# Patient Record
Sex: Female | Born: 1945 | Race: White | Hispanic: No | Marital: Married | Smoking: Never smoker
Health system: Southern US, Community
[De-identification: ages and names within clinical notes are randomized; demographics above are authoritative.]

## PROBLEM LIST (undated history)

## (undated) DIAGNOSIS — E119 Type 2 diabetes mellitus without complications: Secondary | ICD-10-CM

## (undated) DIAGNOSIS — I10 Essential (primary) hypertension: Secondary | ICD-10-CM

## (undated) DIAGNOSIS — K219 Gastro-esophageal reflux disease without esophagitis: Secondary | ICD-10-CM

## (undated) HISTORY — DX: Essential (primary) hypertension: I10

## (undated) HISTORY — DX: Gastro-esophageal reflux disease without esophagitis: K21.9

## (undated) HISTORY — DX: Type 2 diabetes mellitus without complications: E11.9

## (undated) HISTORY — PX: NO PAST SURGERIES: SHX2092

---

## 2000-04-22 ENCOUNTER — Encounter: Payer: Self-pay | Admitting: Obstetrics and Gynecology

## 2000-04-22 ENCOUNTER — Encounter: Admission: RE | Admit: 2000-04-22 | Discharge: 2000-04-22 | Payer: Self-pay | Admitting: Obstetrics and Gynecology

## 2001-04-28 ENCOUNTER — Encounter: Admission: RE | Admit: 2001-04-28 | Discharge: 2001-04-28 | Payer: Self-pay | Admitting: Obstetrics and Gynecology

## 2001-04-28 ENCOUNTER — Encounter: Payer: Self-pay | Admitting: Obstetrics and Gynecology

## 2002-05-25 ENCOUNTER — Encounter: Admission: RE | Admit: 2002-05-25 | Discharge: 2002-05-25 | Payer: Self-pay | Admitting: Obstetrics and Gynecology

## 2002-05-25 ENCOUNTER — Encounter: Payer: Self-pay | Admitting: Obstetrics and Gynecology

## 2003-05-31 ENCOUNTER — Encounter: Admission: RE | Admit: 2003-05-31 | Discharge: 2003-05-31 | Payer: Self-pay | Admitting: Obstetrics and Gynecology

## 2003-05-31 ENCOUNTER — Encounter: Payer: Self-pay | Admitting: Obstetrics and Gynecology

## 2004-06-02 ENCOUNTER — Ambulatory Visit (HOSPITAL_COMMUNITY): Admission: RE | Admit: 2004-06-02 | Discharge: 2004-06-02 | Payer: Self-pay | Admitting: Obstetrics and Gynecology

## 2005-06-11 ENCOUNTER — Ambulatory Visit (HOSPITAL_COMMUNITY): Admission: RE | Admit: 2005-06-11 | Discharge: 2005-06-11 | Payer: Self-pay | Admitting: Obstetrics and Gynecology

## 2006-07-01 ENCOUNTER — Ambulatory Visit (HOSPITAL_COMMUNITY): Admission: RE | Admit: 2006-07-01 | Discharge: 2006-07-01 | Payer: Self-pay | Admitting: Obstetrics and Gynecology

## 2007-07-07 ENCOUNTER — Ambulatory Visit (HOSPITAL_COMMUNITY): Admission: RE | Admit: 2007-07-07 | Discharge: 2007-07-07 | Payer: Self-pay | Admitting: Obstetrics and Gynecology

## 2008-07-24 ENCOUNTER — Ambulatory Visit (HOSPITAL_COMMUNITY): Admission: RE | Admit: 2008-07-24 | Discharge: 2008-07-24 | Payer: Self-pay | Admitting: Obstetrics and Gynecology

## 2009-09-03 ENCOUNTER — Ambulatory Visit: Payer: Self-pay

## 2010-12-09 ENCOUNTER — Ambulatory Visit: Payer: Self-pay

## 2012-02-15 ENCOUNTER — Ambulatory Visit: Payer: Self-pay

## 2013-02-20 ENCOUNTER — Ambulatory Visit: Payer: Self-pay | Admitting: Family Medicine

## 2013-08-20 ENCOUNTER — Emergency Department: Payer: Self-pay | Admitting: Emergency Medicine

## 2013-08-20 LAB — CBC WITH DIFFERENTIAL/PLATELET
BASOS ABS: 0.1 10*3/uL (ref 0.0–0.1)
BASOS PCT: 1.2 %
EOS PCT: 4.8 %
Eosinophil #: 0.4 10*3/uL (ref 0.0–0.7)
HCT: 45.3 % (ref 35.0–47.0)
HGB: 15.4 g/dL (ref 12.0–16.0)
LYMPHS ABS: 2.5 10*3/uL (ref 1.0–3.6)
LYMPHS PCT: 32.4 %
MCH: 30.9 pg (ref 26.0–34.0)
MCHC: 33.9 g/dL (ref 32.0–36.0)
MCV: 91 fL (ref 80–100)
MONOS PCT: 8.8 %
Monocyte #: 0.7 x10 3/mm (ref 0.2–0.9)
Neutrophil #: 4 10*3/uL (ref 1.4–6.5)
Neutrophil %: 52.8 %
Platelet: 228 10*3/uL (ref 150–440)
RBC: 4.98 10*6/uL (ref 3.80–5.20)
RDW: 13 % (ref 11.5–14.5)
WBC: 7.6 10*3/uL (ref 3.6–11.0)

## 2013-08-20 LAB — URINALYSIS, COMPLETE
Bacteria: NONE SEEN
Bilirubin,UR: NEGATIVE
Blood: NEGATIVE
GLUCOSE, UR: NEGATIVE mg/dL (ref 0–75)
Ketone: NEGATIVE
NITRITE: NEGATIVE
PH: 7 (ref 4.5–8.0)
Protein: NEGATIVE
RBC,UR: 1 /HPF (ref 0–5)
Specific Gravity: 1.014 (ref 1.003–1.030)

## 2013-08-20 LAB — BASIC METABOLIC PANEL
ANION GAP: 2 — AB (ref 7–16)
BUN: 18 mg/dL (ref 7–18)
CHLORIDE: 105 mmol/L (ref 98–107)
CO2: 31 mmol/L (ref 21–32)
Calcium, Total: 9.2 mg/dL (ref 8.5–10.1)
Creatinine: 0.75 mg/dL (ref 0.60–1.30)
EGFR (African American): >60
EGFR (Non-African Amer.): >60
GLUCOSE: 115 mg/dL — AB (ref 65–99)
Osmolality: 278 (ref 275–301)
Potassium: 4.2 mmol/L (ref 3.5–5.1)
SODIUM: 138 mmol/L (ref 136–145)

## 2013-08-20 LAB — TROPONIN I: Troponin-I: <0.02 ng/mL

## 2014-01-23 DIAGNOSIS — E785 Hyperlipidemia, unspecified: Secondary | ICD-10-CM | POA: Insufficient documentation

## 2014-04-01 ENCOUNTER — Ambulatory Visit: Payer: Self-pay | Admitting: Internal Medicine

## 2014-10-10 DIAGNOSIS — B351 Tinea unguium: Secondary | ICD-10-CM | POA: Diagnosis not present

## 2014-10-10 DIAGNOSIS — M542 Cervicalgia: Secondary | ICD-10-CM | POA: Diagnosis not present

## 2014-10-10 DIAGNOSIS — E780 Pure hypercholesterolemia: Secondary | ICD-10-CM | POA: Diagnosis not present

## 2014-11-12 DIAGNOSIS — B078 Other viral warts: Secondary | ICD-10-CM | POA: Diagnosis not present

## 2014-11-12 DIAGNOSIS — L820 Inflamed seborrheic keratosis: Secondary | ICD-10-CM | POA: Diagnosis not present

## 2014-11-12 DIAGNOSIS — L821 Other seborrheic keratosis: Secondary | ICD-10-CM | POA: Diagnosis not present

## 2015-01-20 DIAGNOSIS — B351 Tinea unguium: Secondary | ICD-10-CM | POA: Diagnosis not present

## 2015-01-20 DIAGNOSIS — E780 Pure hypercholesterolemia: Secondary | ICD-10-CM | POA: Diagnosis not present

## 2015-01-24 DIAGNOSIS — Z0001 Encounter for general adult medical examination with abnormal findings: Secondary | ICD-10-CM | POA: Diagnosis not present

## 2015-07-17 DIAGNOSIS — R739 Hyperglycemia, unspecified: Secondary | ICD-10-CM | POA: Diagnosis not present

## 2015-07-17 DIAGNOSIS — Z1159 Encounter for screening for other viral diseases: Secondary | ICD-10-CM | POA: Diagnosis not present

## 2015-07-17 DIAGNOSIS — E7800 Pure hypercholesterolemia, unspecified: Secondary | ICD-10-CM | POA: Diagnosis not present

## 2015-07-24 DIAGNOSIS — E781 Pure hyperglyceridemia: Secondary | ICD-10-CM | POA: Diagnosis not present

## 2015-07-24 DIAGNOSIS — E7800 Pure hypercholesterolemia, unspecified: Secondary | ICD-10-CM | POA: Diagnosis not present

## 2015-09-11 DIAGNOSIS — H2513 Age-related nuclear cataract, bilateral: Secondary | ICD-10-CM | POA: Diagnosis not present

## 2015-11-03 DIAGNOSIS — H524 Presbyopia: Secondary | ICD-10-CM | POA: Diagnosis not present

## 2015-11-03 DIAGNOSIS — H5210 Myopia, unspecified eye: Secondary | ICD-10-CM | POA: Diagnosis not present

## 2016-01-19 DIAGNOSIS — E7800 Pure hypercholesterolemia, unspecified: Secondary | ICD-10-CM | POA: Diagnosis not present

## 2016-01-19 DIAGNOSIS — E781 Pure hyperglyceridemia: Secondary | ICD-10-CM | POA: Diagnosis not present

## 2016-01-26 DIAGNOSIS — K219 Gastro-esophageal reflux disease without esophagitis: Secondary | ICD-10-CM | POA: Diagnosis not present

## 2016-01-26 DIAGNOSIS — E7800 Pure hypercholesterolemia, unspecified: Secondary | ICD-10-CM | POA: Diagnosis not present

## 2016-01-26 DIAGNOSIS — Z0001 Encounter for general adult medical examination with abnormal findings: Secondary | ICD-10-CM | POA: Diagnosis not present

## 2016-01-26 DIAGNOSIS — R7301 Impaired fasting glucose: Secondary | ICD-10-CM | POA: Diagnosis not present

## 2016-07-14 DIAGNOSIS — R7301 Impaired fasting glucose: Secondary | ICD-10-CM | POA: Diagnosis not present

## 2016-07-21 DIAGNOSIS — E119 Type 2 diabetes mellitus without complications: Secondary | ICD-10-CM | POA: Insufficient documentation

## 2016-09-17 DIAGNOSIS — B9689 Other specified bacterial agents as the cause of diseases classified elsewhere: Secondary | ICD-10-CM | POA: Diagnosis not present

## 2016-09-17 DIAGNOSIS — J019 Acute sinusitis, unspecified: Secondary | ICD-10-CM | POA: Diagnosis not present

## 2016-12-23 DIAGNOSIS — H35372 Puckering of macula, left eye: Secondary | ICD-10-CM | POA: Diagnosis not present

## 2016-12-23 DIAGNOSIS — H2513 Age-related nuclear cataract, bilateral: Secondary | ICD-10-CM | POA: Diagnosis not present

## 2017-01-20 DIAGNOSIS — E119 Type 2 diabetes mellitus without complications: Secondary | ICD-10-CM | POA: Diagnosis not present

## 2017-01-27 DIAGNOSIS — E7800 Pure hypercholesterolemia, unspecified: Secondary | ICD-10-CM | POA: Diagnosis not present

## 2017-01-27 DIAGNOSIS — Z0000 Encounter for general adult medical examination without abnormal findings: Secondary | ICD-10-CM | POA: Diagnosis not present

## 2017-01-27 DIAGNOSIS — Z0001 Encounter for general adult medical examination with abnormal findings: Secondary | ICD-10-CM | POA: Diagnosis not present

## 2017-01-27 DIAGNOSIS — E119 Type 2 diabetes mellitus without complications: Secondary | ICD-10-CM | POA: Diagnosis not present

## 2017-03-11 ENCOUNTER — Other Ambulatory Visit: Payer: Self-pay | Admitting: Internal Medicine

## 2017-03-11 DIAGNOSIS — Z1231 Encounter for screening mammogram for malignant neoplasm of breast: Secondary | ICD-10-CM

## 2017-03-30 ENCOUNTER — Ambulatory Visit
Admission: RE | Admit: 2017-03-30 | Discharge: 2017-03-30 | Disposition: A | Discharge status: Home / Self Care | Payer: Medicare HMO | Source: Ambulatory Visit | Attending: Internal Medicine | Admitting: Internal Medicine

## 2017-03-30 DIAGNOSIS — Z1231 Encounter for screening mammogram for malignant neoplasm of breast: Secondary | ICD-10-CM | POA: Insufficient documentation

## 2017-04-04 DIAGNOSIS — D485 Neoplasm of uncertain behavior of skin: Secondary | ICD-10-CM | POA: Diagnosis not present

## 2017-04-04 DIAGNOSIS — D2271 Melanocytic nevi of right lower limb, including hip: Secondary | ICD-10-CM | POA: Diagnosis not present

## 2017-04-04 DIAGNOSIS — D2272 Melanocytic nevi of left lower limb, including hip: Secondary | ICD-10-CM | POA: Diagnosis not present

## 2017-04-04 DIAGNOSIS — D225 Melanocytic nevi of trunk: Secondary | ICD-10-CM | POA: Diagnosis not present

## 2017-04-04 DIAGNOSIS — D2262 Melanocytic nevi of left upper limb, including shoulder: Secondary | ICD-10-CM | POA: Diagnosis not present

## 2017-04-04 DIAGNOSIS — L570 Actinic keratosis: Secondary | ICD-10-CM | POA: Diagnosis not present

## 2017-04-07 DIAGNOSIS — L570 Actinic keratosis: Secondary | ICD-10-CM | POA: Diagnosis not present

## 2017-04-07 DIAGNOSIS — L578 Other skin changes due to chronic exposure to nonionizing radiation: Secondary | ICD-10-CM | POA: Diagnosis not present

## 2017-07-22 DIAGNOSIS — E119 Type 2 diabetes mellitus without complications: Secondary | ICD-10-CM | POA: Diagnosis not present

## 2017-07-29 DIAGNOSIS — Z5181 Encounter for therapeutic drug level monitoring: Secondary | ICD-10-CM | POA: Diagnosis not present

## 2017-07-29 DIAGNOSIS — E119 Type 2 diabetes mellitus without complications: Secondary | ICD-10-CM | POA: Diagnosis not present

## 2017-07-29 DIAGNOSIS — K219 Gastro-esophageal reflux disease without esophagitis: Secondary | ICD-10-CM | POA: Diagnosis not present

## 2017-07-29 DIAGNOSIS — E7800 Pure hypercholesterolemia, unspecified: Secondary | ICD-10-CM | POA: Diagnosis not present

## 2018-01-26 DIAGNOSIS — E119 Type 2 diabetes mellitus without complications: Secondary | ICD-10-CM | POA: Diagnosis not present

## 2018-01-26 DIAGNOSIS — Z5181 Encounter for therapeutic drug level monitoring: Secondary | ICD-10-CM | POA: Diagnosis not present

## 2018-01-26 DIAGNOSIS — E7800 Pure hypercholesterolemia, unspecified: Secondary | ICD-10-CM | POA: Diagnosis not present

## 2018-02-02 DIAGNOSIS — Z0000 Encounter for general adult medical examination without abnormal findings: Secondary | ICD-10-CM | POA: Diagnosis not present

## 2018-02-02 DIAGNOSIS — K219 Gastro-esophageal reflux disease without esophagitis: Secondary | ICD-10-CM | POA: Diagnosis not present

## 2018-02-02 DIAGNOSIS — E119 Type 2 diabetes mellitus without complications: Secondary | ICD-10-CM | POA: Diagnosis not present

## 2018-02-02 DIAGNOSIS — E7800 Pure hypercholesterolemia, unspecified: Secondary | ICD-10-CM | POA: Diagnosis not present

## 2018-02-02 DIAGNOSIS — Z0001 Encounter for general adult medical examination with abnormal findings: Secondary | ICD-10-CM | POA: Diagnosis not present

## 2018-02-20 ENCOUNTER — Other Ambulatory Visit: Payer: Self-pay | Admitting: Internal Medicine

## 2018-02-20 DIAGNOSIS — Z1231 Encounter for screening mammogram for malignant neoplasm of breast: Secondary | ICD-10-CM

## 2018-03-31 DIAGNOSIS — H35372 Puckering of macula, left eye: Secondary | ICD-10-CM | POA: Diagnosis not present

## 2018-03-31 DIAGNOSIS — H2513 Age-related nuclear cataract, bilateral: Secondary | ICD-10-CM | POA: Diagnosis not present

## 2018-04-03 ENCOUNTER — Ambulatory Visit
Admission: RE | Admit: 2018-04-03 | Discharge: 2018-04-03 | Disposition: A | Discharge status: Home / Self Care | Payer: Medicare HMO | Source: Ambulatory Visit | Attending: Internal Medicine | Admitting: Internal Medicine

## 2018-04-03 DIAGNOSIS — Z1231 Encounter for screening mammogram for malignant neoplasm of breast: Secondary | ICD-10-CM

## 2018-07-31 DIAGNOSIS — E119 Type 2 diabetes mellitus without complications: Secondary | ICD-10-CM | POA: Diagnosis not present

## 2018-08-08 DIAGNOSIS — E7800 Pure hypercholesterolemia, unspecified: Secondary | ICD-10-CM | POA: Diagnosis not present

## 2018-08-08 DIAGNOSIS — K219 Gastro-esophageal reflux disease without esophagitis: Secondary | ICD-10-CM | POA: Diagnosis not present

## 2018-08-08 DIAGNOSIS — E119 Type 2 diabetes mellitus without complications: Secondary | ICD-10-CM | POA: Diagnosis not present

## 2019-01-30 DIAGNOSIS — E119 Type 2 diabetes mellitus without complications: Secondary | ICD-10-CM | POA: Diagnosis not present

## 2019-02-06 DIAGNOSIS — Z0001 Encounter for general adult medical examination with abnormal findings: Secondary | ICD-10-CM | POA: Diagnosis not present

## 2019-02-06 DIAGNOSIS — E119 Type 2 diabetes mellitus without complications: Secondary | ICD-10-CM | POA: Diagnosis not present

## 2019-02-06 DIAGNOSIS — E7800 Pure hypercholesterolemia, unspecified: Secondary | ICD-10-CM | POA: Diagnosis not present

## 2019-02-06 DIAGNOSIS — Z0000 Encounter for general adult medical examination without abnormal findings: Secondary | ICD-10-CM | POA: Diagnosis not present

## 2019-02-06 DIAGNOSIS — K219 Gastro-esophageal reflux disease without esophagitis: Secondary | ICD-10-CM | POA: Diagnosis not present

## 2019-03-13 ENCOUNTER — Other Ambulatory Visit: Payer: Self-pay | Admitting: Internal Medicine

## 2019-03-13 DIAGNOSIS — Z1231 Encounter for screening mammogram for malignant neoplasm of breast: Secondary | ICD-10-CM

## 2019-04-13 ENCOUNTER — Ambulatory Visit
Admission: RE | Admit: 2019-04-13 | Discharge: 2019-04-13 | Disposition: A | Discharge status: Home / Self Care | Payer: Medicare HMO | Source: Ambulatory Visit | Attending: Internal Medicine | Admitting: Internal Medicine

## 2019-04-13 DIAGNOSIS — Z1231 Encounter for screening mammogram for malignant neoplasm of breast: Secondary | ICD-10-CM

## 2019-04-18 ENCOUNTER — Other Ambulatory Visit: Payer: Self-pay | Admitting: Internal Medicine

## 2019-04-18 DIAGNOSIS — R928 Other abnormal and inconclusive findings on diagnostic imaging of breast: Secondary | ICD-10-CM

## 2019-04-18 DIAGNOSIS — N6320 Unspecified lump in the left breast, unspecified quadrant: Secondary | ICD-10-CM

## 2019-04-30 ENCOUNTER — Ambulatory Visit
Admission: RE | Admit: 2019-04-30 | Discharge: 2019-04-30 | Disposition: A | Discharge status: Home / Self Care | Payer: Medicare HMO | Source: Ambulatory Visit | Attending: Internal Medicine | Admitting: Internal Medicine

## 2019-04-30 DIAGNOSIS — N6320 Unspecified lump in the left breast, unspecified quadrant: Secondary | ICD-10-CM | POA: Diagnosis not present

## 2019-04-30 DIAGNOSIS — N6489 Other specified disorders of breast: Secondary | ICD-10-CM | POA: Diagnosis not present

## 2019-04-30 DIAGNOSIS — R928 Other abnormal and inconclusive findings on diagnostic imaging of breast: Secondary | ICD-10-CM

## 2019-05-02 ENCOUNTER — Other Ambulatory Visit: Payer: Self-pay | Admitting: Internal Medicine

## 2019-05-02 DIAGNOSIS — N6320 Unspecified lump in the left breast, unspecified quadrant: Secondary | ICD-10-CM

## 2019-07-30 DIAGNOSIS — E119 Type 2 diabetes mellitus without complications: Secondary | ICD-10-CM | POA: Diagnosis not present

## 2019-08-08 DIAGNOSIS — E119 Type 2 diabetes mellitus without complications: Secondary | ICD-10-CM | POA: Diagnosis not present

## 2019-08-08 DIAGNOSIS — E7800 Pure hypercholesterolemia, unspecified: Secondary | ICD-10-CM | POA: Diagnosis not present

## 2019-08-08 DIAGNOSIS — K219 Gastro-esophageal reflux disease without esophagitis: Secondary | ICD-10-CM | POA: Diagnosis not present

## 2019-08-08 DIAGNOSIS — Z87891 Personal history of nicotine dependence: Secondary | ICD-10-CM | POA: Diagnosis not present

## 2019-08-08 DIAGNOSIS — Z79899 Other long term (current) drug therapy: Secondary | ICD-10-CM | POA: Diagnosis not present

## 2019-10-04 DIAGNOSIS — H2513 Age-related nuclear cataract, bilateral: Secondary | ICD-10-CM | POA: Diagnosis not present

## 2019-10-04 DIAGNOSIS — H35372 Puckering of macula, left eye: Secondary | ICD-10-CM | POA: Diagnosis not present

## 2020-03-13 DIAGNOSIS — E119 Type 2 diabetes mellitus without complications: Secondary | ICD-10-CM | POA: Diagnosis not present

## 2020-03-20 DIAGNOSIS — K219 Gastro-esophageal reflux disease without esophagitis: Secondary | ICD-10-CM | POA: Diagnosis not present

## 2020-03-20 DIAGNOSIS — Z0001 Encounter for general adult medical examination with abnormal findings: Secondary | ICD-10-CM | POA: Diagnosis not present

## 2020-03-20 DIAGNOSIS — Z0000 Encounter for general adult medical examination without abnormal findings: Secondary | ICD-10-CM | POA: Diagnosis not present

## 2020-03-20 DIAGNOSIS — E7800 Pure hypercholesterolemia, unspecified: Secondary | ICD-10-CM | POA: Diagnosis not present

## 2020-03-20 DIAGNOSIS — E119 Type 2 diabetes mellitus without complications: Secondary | ICD-10-CM | POA: Diagnosis not present

## 2020-06-03 ENCOUNTER — Other Ambulatory Visit: Payer: Self-pay | Admitting: Internal Medicine

## 2020-06-03 DIAGNOSIS — R928 Other abnormal and inconclusive findings on diagnostic imaging of breast: Secondary | ICD-10-CM

## 2020-06-09 ENCOUNTER — Ambulatory Visit (INDEPENDENT_AMBULATORY_CARE_PROVIDER_SITE_OTHER): Payer: Medicare HMO | Admitting: Obstetrics and Gynecology

## 2020-06-09 ENCOUNTER — Encounter: Payer: Self-pay | Admitting: Obstetrics and Gynecology

## 2020-06-09 ENCOUNTER — Other Ambulatory Visit: Payer: Self-pay

## 2020-06-09 VITALS — BP 100/68 | Ht 62.0 in | Wt 137.8 lb

## 2020-06-09 DIAGNOSIS — N8110 Cystocele, unspecified: Secondary | ICD-10-CM | POA: Diagnosis not present

## 2020-06-09 DIAGNOSIS — R319 Hematuria, unspecified: Secondary | ICD-10-CM

## 2020-06-09 DIAGNOSIS — R300 Dysuria: Secondary | ICD-10-CM

## 2020-06-09 DIAGNOSIS — N950 Postmenopausal bleeding: Secondary | ICD-10-CM

## 2020-06-09 LAB — POCT URINALYSIS DIPSTICK
Bilirubin, UA: NEGATIVE
Blood, UA: NEGATIVE
Glucose, UA: NEGATIVE
Ketones, UA: NEGATIVE
Nitrite, UA: NEGATIVE
Protein, UA: NEGATIVE
Spec Grav, UA: 1.015 (ref 1.010–1.025)
Urobilinogen, UA: 0.2 E.U./dL
pH, UA: 5.5 (ref 5.0–8.0)

## 2020-06-09 NOTE — Progress Notes (Signed)
Pt states she has a bladder issue. Pt states that when she wipe after voiding she saw some blood on her tissue.

## 2020-06-09 NOTE — Progress Notes (Signed)
Patient ID: Kari Mcgee, female   DOB: 07-04-1946, 74 y.o.   MRN: [id number]  Reason for Consult: Gynecologic Exam   Referred by Baxter Hire, MD  Subjective:     HPI:  Kari Mcgee is a 74 y.o. female.  Patient presents today with complaints of hematuria.  She reports that 2 or 3 days ago she saw a pink color on the toilet paper after urination.  She reports that this is the first time that this has happened.  She denies seeing blood in her urine itself.  She reports that yesterday she had pain with urination.  She denies increase in frequency.  She denies any history of kidney stones or urinary tract infections.  She reports that she has never had postmenopausal bleeding.  She has no prior history of fibroids or polyps.  She is not currently a smoker and has no history of smoking.  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  She is not currently sexually active and does not desire to become sexually active.  She denies any exterior itching or vulvar pain.  She does report that she feels a vaginal bulge especially with exertion.  She feels like the bleeding occurred after she lifted her mother to help her mother urinate.  She reports that the sensor and sensation of a bulge or balloon present in between her legs is felt occasionally and is somewhat bothersome.  History reviewed. No pertinent past medical history. Family History  Problem Relation Age of Onset  . Heart failure Mother   . Breast cancer Neg Hx    History reviewed. No pertinent surgical history.  Short Social History:  Social History   Tobacco Use  . Smoking status: Never Smoker  . Smokeless tobacco: Never Used  Substance Use Topics  . Alcohol use: Never    No Known Allergies  Current Outpatient Medications  Medication Sig Dispense Refill  . pantoprazole (PROTONIX) 40 MG tablet     . simvastatin (ZOCOR) 20 MG tablet      No current facility-administered medications for this visit.    Review of Systems    Constitutional: Negative for chills, fatigue, fever and unexpected weight change.  HENT: Negative for trouble swallowing.  Eyes: Negative for loss of vision.  Respiratory: Negative for cough, shortness of breath and wheezing.  Cardiovascular: Negative for chest pain, leg swelling, palpitations and syncope.  GI: Negative for abdominal pain, blood in stool, diarrhea, nausea and vomiting.  GU: Positive for hematuria. Negative for difficulty urinating, dysuria and frequency.  Musculoskeletal: Negative for back pain, leg pain and joint pain.  Skin: Negative for rash.  Neurological: Negative for dizziness, headaches, light-headedness, numbness and seizures.  Psychiatric: Negative for behavioral problem, confusion, depressed mood and sleep disturbance.        Objective:  Objective   Vitals:   06/09/20 0814  BP: 100/68  Weight: 137 lb 12.8 oz (62.5 kg)  Height: 5\' 2"  (1.575 m)   Body mass index is 25.2 kg/m.  Physical Exam Vitals and nursing note reviewed.  Constitutional:      Appearance: She is well-developed.  HENT:     Head: Normocephalic and atraumatic.  Eyes:     Pupils: Pupils are equal, round, and reactive to light.  Cardiovascular:     Rate and Rhythm: Normal rate and regular rhythm.  Pulmonary:     Effort: Pulmonary effort is normal. No respiratory distress.  Genitourinary:    Comments: External: Normal appearing vulva. No lesions noted.  Speculum examination: Normal appearing cervix. No blood in the vaginal vault. NO discharge.   Bimanual examination: Uterus midline, non-tender, normal in size, shape and contour.  No CMT. No adnexal masses. No adnexal tenderness. Pelvis not fixed.   Stage 2 prolapse evident on exam. See POP-Q Skin:    General: Skin is warm and dry.  Neurological:     Mental Status: She is alert and oriented to person, place, and time.  Psychiatric:        Behavior: Behavior normal.        Thought Content: Thought content normal.         Judgment: Judgment normal.      POP-Q exam:      Aa = -1 Ba = +1 C = -3  gH = 3 pb = 1 TVL = 6  Ap = -3 BP = -3 D = 6   Summary statement of POP-Q exam:    the anterior vaginal wall is 1 cm in front the level of the hymen the cuff / cervix is 3 cm behind the level of the hymen,  and the posterior vaginal wall is 3cm behind the level of the hymen  Stages of POP-Q system measurement Stage 2 the most distal portion of the prolapse is 1 cm or less proximal or distal to the hymenal plane      Assessment/Plan:     74 year old with complaints of hematuria versus postmenopausal bleeding.  1.  Hematuria- Urine today is grossly normal in color.  No evidence of frank hematuria.  Will send for microscopic evaluation and urine culture.  Will repeat microscopic evaluation in 6 weeks.  Office urinalysis negative for blood.  2. Stage II prolapse apical and cystocele.  Discussed options with management of prolapse with the patient.  Patient was given AUGS handouts.  We will follow up with the patient patient to discuss further at her next visit.  3. Possible postmenopausal bleeding vs. Hematuria vs. atrophy- will follow up for TVUS in 2 weeks.   More than 45 minutes were spent face to face with the patient in the room, reviewing the medical record, labs and images, and coordinating care for the patient. The plan of management was discussed in detail and counseling was provided.     Adrian Prows MD [facility], [facility] 06/09/2020 8:43 AM

## 2020-06-09 NOTE — Patient Instructions (Signed)
Hematuria, Adult Hematuria is blood in the urine. Blood may be visible in the urine, or it may be identified with a test. This condition can be caused by infections of the bladder, urethra, kidney, or prostate. Other possible causes include:  Kidney stones.  Cancer of the urinary tract.  Too much calcium in the urine.  Conditions that are passed from parent to child (inherited conditions).  Exercise that requires a lot of energy. Infections can usually be treated with medicine, and a kidney stone usually will pass through your urine. If neither of these is the cause of your hematuria, more tests may be needed to identify the cause of your symptoms. It is very important to tell your health care provider about any blood in your urine, even if it is painless or the blood stops without treatment. Blood in the urine, when it happens and then stops and then happens again, can be a symptom of a very serious condition, including cancer. There is no pain in the initial stages of many urinary cancers. Follow these instructions at home: Medicines  Take over-the-counter and prescription medicines only as told by your health care provider.  If you were prescribed an antibiotic medicine, take it as told by your health care provider. Do not stop taking the antibiotic even if you start to feel better. Eating and drinking  Drink enough fluid to keep your urine clear or pale yellow. It is recommended that you drink 3-4 quarts (2.8-3.8 L) a day. If you have been diagnosed with an infection, it is recommended that you drink cranberry juice in addition to large amounts of water.  Avoid caffeine, tea, and carbonated beverages. These tend to irritate the bladder.  Avoid alcohol because it may irritate the prostate (men). General instructions  If you have been diagnosed with a kidney stone, follow your health care provider's instructions about straining your urine to catch the stone.  Empty your bladder  often. Avoid holding urine for long periods of time.  If you are female: ? After a bowel movement, wipe from front to back and use each piece of toilet paper only once. ? Empty your bladder before and after sex.  Pay attention to any changes in your symptoms. Tell your health care provider about any changes or any new symptoms.  It is your responsibility to get your test results. Ask your health care provider, or the department performing the test, when your results will be ready.  Keep all follow-up visits as told by your health care provider. This is important. Contact a health care provider if:  You develop back pain.  You have a fever.  You have nausea or vomiting.  Your symptoms do not improve after 3 days.  Your symptoms get worse. Get help right away if:  You develop severe vomiting and are unable take medicine without vomiting.  You develop severe pain in your back or abdomen even though you are taking medicine.  You pass a large amount of blood in your urine.  You pass blood clots in your urine.  You feel very weak or like you might faint.  You faint. Summary  Hematuria is blood in the urine. It has many possible causes.  It is very important that you tell your health care provider about any blood in your urine, even if it is painless or the blood stops without treatment.  Take over-the-counter and prescription medicines only as told by your health care provider.  Drink enough fluid to keep   your urine clear or pale yellow. This information is not intended to replace advice given to you by your health care provider. Make sure you discuss any questions you have with your health care provider. Document Revised: 12/20/2018 Document Reviewed: 08/28/2016 Elsevier Patient Education  2020 [employer]vier Inc.  

## 2020-06-10 LAB — URINALYSIS, MICROSCOPIC ONLY
Casts: NONE SEEN /lpf
RBC: NONE SEEN /hpf (ref 0–2)
WBC, UA: NONE SEEN /hpf (ref 0–5)

## 2020-06-11 LAB — URINE CULTURE

## 2020-06-27 ENCOUNTER — Ambulatory Visit
Admission: RE | Admit: 2020-06-27 | Discharge: 2020-06-27 | Disposition: A | Discharge status: Home / Self Care | Payer: Medicare HMO | Source: Ambulatory Visit | Attending: Internal Medicine | Admitting: Internal Medicine

## 2020-06-27 ENCOUNTER — Other Ambulatory Visit: Payer: Self-pay

## 2020-06-27 DIAGNOSIS — R928 Other abnormal and inconclusive findings on diagnostic imaging of breast: Secondary | ICD-10-CM | POA: Insufficient documentation

## 2020-06-30 ENCOUNTER — Ambulatory Visit (INDEPENDENT_AMBULATORY_CARE_PROVIDER_SITE_OTHER): Payer: Medicare HMO | Admitting: Obstetrics and Gynecology

## 2020-06-30 ENCOUNTER — Ambulatory Visit (INDEPENDENT_AMBULATORY_CARE_PROVIDER_SITE_OTHER): Payer: Medicare HMO

## 2020-06-30 ENCOUNTER — Encounter: Payer: Self-pay | Admitting: Obstetrics and Gynecology

## 2020-06-30 ENCOUNTER — Other Ambulatory Visit: Payer: Self-pay

## 2020-06-30 VITALS — Ht 62.0 in | Wt 136.0 lb

## 2020-06-30 DIAGNOSIS — N950 Postmenopausal bleeding: Secondary | ICD-10-CM | POA: Diagnosis not present

## 2020-06-30 NOTE — Progress Notes (Signed)
Virtual Visit via Telephone Note  I connected with Kari Mcgee on 06/30/20 at  1:30 PM EST by telephone and verified that I am speaking with the correct person using two identifiers.   I discussed the limitations, risks, security and privacy concerns of performing an evaluation and management service by telephone and the availability of in person appointments. I also discussed with the patient that there may be a patient responsible charge related to this service. The patient expressed understanding and agreed to proceed.  The patient was at home I spoke with the patient from my  office The names of people involved in this encounter were: Hassan Rowan and Dr. Gilman Schmidt  History of Present Illness: She has been feeling better. No further bleeding. Following up for pelvic US today. Reports prolapse has not been bothersome.   Observations/Objective:  Physical Exam could not be performed. Because of the COVID-19 outbreak this visit was performed over the phone and not in person.   Assessment and Plan: 74 yo with postmenopausal bleeding vs hematuria. Microscopic urinalysis normal. Urine culture negative. [place] today shows thin endometrium. Low risk of hyperplasia or endometrial cancer. May be atrophic bleeding. Patient will follow up in January for repeat urine sample.   Follow Up Instructions: January 2022   I discussed the assessment and treatment plan with the patient. The patient was provided an opportunity to ask questions and all were answered. The patient agreed with the plan and demonstrated an understanding of the instructions.   The patient was advised to call back or seek an in-person evaluation if the symptoms worsen or if the condition fails to improve as anticipated.  I provided 5 minutes of non-face-to-face time during this encounter.  Adrian Prows MD [facility], [facility] 06/30/2020 1:55 PM

## 2020-06-30 NOTE — Progress Notes (Signed)
Telephone visit pt had an [place].

## 2020-07-23 ENCOUNTER — Encounter: Payer: Self-pay | Admitting: Obstetrics and Gynecology

## 2020-07-23 ENCOUNTER — Ambulatory Visit (INDEPENDENT_AMBULATORY_CARE_PROVIDER_SITE_OTHER): Payer: Medicare HMO | Admitting: Obstetrics and Gynecology

## 2020-07-23 ENCOUNTER — Other Ambulatory Visit: Payer: Self-pay

## 2020-07-23 VITALS — BP 120/70 | Ht 62.0 in | Wt 139.0 lb

## 2020-07-23 DIAGNOSIS — R319 Hematuria, unspecified: Secondary | ICD-10-CM

## 2020-07-23 DIAGNOSIS — R300 Dysuria: Secondary | ICD-10-CM | POA: Diagnosis not present

## 2020-07-23 LAB — POCT URINALYSIS DIPSTICK
Bilirubin, UA: NEGATIVE
Blood, UA: NEGATIVE
Glucose, UA: NEGATIVE
Ketones, UA: NEGATIVE
Leukocytes, UA: NEGATIVE
Nitrite, UA: NEGATIVE
Protein, UA: NEGATIVE
Spec Grav, UA: 1.025 (ref 1.010–1.025)
Urobilinogen, UA: 0.2 E.U./dL
pH, UA: 5 (ref 5.0–8.0)

## 2020-07-23 NOTE — Progress Notes (Signed)
Follow up.

## 2020-07-23 NOTE — Progress Notes (Signed)
Patient ID: Kari Mcgee, female   DOB: Jan 09, 1946, 74 y.o.   MRN: [id number]  Reason for Consult: Follow-up   Referred by Baxter Hire, MD  Subjective:     HPI:  Kari Mcgee is a 74 y.o. female.  She is following up today regarding hematuria and a repeat urine sample.  She reports that she has not had no fluid further evidence of bleeding or blood in her urine.  Previous microscopic urinalysis was negative for red blood cells.  She reports that she continues to have some bothersome symptoms of her prolapse.  She is previously discussed with the office and has been guided with handouts regarding management of prolapse.  She is currently undecided regarding how she would like to proceed either with surgery or with management by pessary.   History reviewed. No pertinent past medical history. Family History  Problem Relation Age of Onset  . Heart failure Mother   . Breast cancer Neg Hx    History reviewed. No pertinent surgical history.  Short Social History:  Social History   Tobacco Use  . Smoking status: Never Smoker  . Smokeless tobacco: Never Used  Substance Use Topics  . Alcohol use: Never    No Known Allergies  Current Outpatient Medications  Medication Sig Dispense Refill  . pantoprazole (PROTONIX) 40 MG tablet     . simvastatin (ZOCOR) 20 MG tablet      No current facility-administered medications for this visit.    Review of Systems  Constitutional: Negative for chills, fatigue, fever and unexpected weight change.  HENT: Negative for trouble swallowing.  Eyes: Negative for loss of vision.  Respiratory: Negative for cough, shortness of breath and wheezing.  Cardiovascular: Negative for chest pain, leg swelling, palpitations and syncope.  GI: Negative for abdominal pain, blood in stool, diarrhea, nausea and vomiting.  GU: Negative for difficulty urinating, dysuria, frequency and hematuria.  Musculoskeletal: Negative for back pain, leg pain and  joint pain.  Skin: Negative for rash.  Neurological: Negative for dizziness, headaches, light-headedness, numbness and seizures.  Psychiatric: Negative for behavioral problem, confusion, depressed mood and sleep disturbance.        Objective:  Objective   Vitals:   07/23/20 0850  BP: 120/70  Weight: 139 lb (63 kg)  Height: 5\' 2"  (1.575 m)   Body mass index is 25.42 kg/m.  Physical Exam Vitals and nursing note reviewed.  Constitutional:      Appearance: She is well-developed and well-nourished.  HENT:     Head: Normocephalic and atraumatic.  Eyes:     Extraocular Movements: EOM normal.     Pupils: Pupils are equal, round, and reactive to light.  Cardiovascular:     Rate and Rhythm: Normal rate and regular rhythm.  Pulmonary:     Effort: Pulmonary effort is normal. No respiratory distress.  Skin:    General: Skin is warm and dry.  Neurological:     Mental Status: She is alert and oriented to person, place, and time.  Psychiatric:        Mood and Affect: Mood and affect normal.        Behavior: Behavior normal.        Thought Content: Thought content normal.        Judgment: Judgment normal.     Assessment/Plan:     74 year old with hematuria. Repeat repeat microscopic evaluation for hematuria today in office with urine sample collected. History of prolapse stage II have discussed options with  patient regarding management.  Patient will consider options related to surgery versus pessary and will follow up once she has made a decision.  More than 20 minutes were spent face to face with the patient in the room, reviewing the medical record, labs and images, and coordinating care for the patient. The plan of management was discussed in detail and counseling was provided.    Adrian Prows MD [facility], [facility] 07/23/2020 9:11 AM

## 2020-08-11 ENCOUNTER — Encounter: Payer: Self-pay | Admitting: Obstetrics and Gynecology

## 2020-08-11 ENCOUNTER — Ambulatory Visit: Payer: Medicare HMO | Admitting: Obstetrics and Gynecology

## 2020-08-11 ENCOUNTER — Other Ambulatory Visit: Payer: Self-pay

## 2020-08-11 VITALS — BP 108/62 | HR 73 | Ht 62.0 in | Wt 139.0 lb

## 2020-08-11 DIAGNOSIS — R319 Hematuria, unspecified: Secondary | ICD-10-CM

## 2020-08-11 LAB — POCT URINALYSIS DIPSTICK
Appearance: NORMAL
Bilirubin, UA: NEGATIVE
Blood, UA: NEGATIVE
Glucose, UA: NEGATIVE
Ketones, UA: NEGATIVE
Leukocytes, UA: NEGATIVE
Nitrite, UA: NEGATIVE
Odor: NORMAL
Protein, UA: NEGATIVE
Spec Grav, UA: 1.01 (ref 1.010–1.025)
Urobilinogen, UA: 0.2 E.U./dL
pH, UA: 6 (ref 5.0–8.0)

## 2020-08-11 NOTE — Progress Notes (Signed)
Patient ID: Kari Mcgee, female   DOB: [birth year], 75 y.o.   MRN: [id number]  Reason for Consult: Hematuria (Repeat Urinalysis. No additional problems)   Referred by Baxter Hire, MD  Subjective:     HPI:  Kari Mcgee is a 75 y.o. female. She has not seen any further hematuria. She is undecided regarding treatment for prolapse and is currently most bothered by it on Saturdays when she stands most of the day with work.   No past medical history on file. Family History  Problem Relation Age of Onset  . Heart failure Mother   . Breast cancer Neg Hx    No past surgical history on file.  Short Social History:  Social History   Tobacco Use  . Smoking status: Never Smoker  . Smokeless tobacco: Never Used  Substance Use Topics  . Alcohol use: Never    No Known Allergies  Current Outpatient Medications  Medication Sig Dispense Refill  . fluticasone (FLONASE) 50 MCG/ACT nasal spray SPRAY 2 SPRAYS INTO EACH NOSTRIL EVERY DAY    . pantoprazole (PROTONIX) 40 MG tablet     . simvastatin (ZOCOR) 20 MG tablet     . SHINGRIX injection      No current facility-administered medications for this visit.    Review of Systems  Constitutional: Negative for chills, fatigue, fever and unexpected weight change.  HENT: Negative for trouble swallowing.  Eyes: Negative for loss of vision.  Respiratory: Negative for cough, shortness of breath and wheezing.  Cardiovascular: Negative for chest pain, leg swelling, palpitations and syncope.  GI: Negative for abdominal pain, blood in stool, diarrhea, nausea and vomiting.  GU: Negative for difficulty urinating, dysuria, frequency and hematuria.  Musculoskeletal: Negative for back pain, leg pain and joint pain.  Skin: Negative for rash.  Neurological: Negative for dizziness, headaches, light-headedness, numbness and seizures.  Psychiatric: Negative for behavioral problem, confusion, depressed mood and sleep disturbance.         Objective:  Objective   Vitals:   08/11/20 0911  BP: 108/62  Pulse: 73  Weight: 139 lb (63 kg)  Height: 5\' 2"  (1.575 m)   Body mass index is 25.42 kg/m.  Physical Exam Vitals and nursing note reviewed.  Constitutional:      Appearance: She is well-developed and well-nourished.  HENT:     Head: Normocephalic and atraumatic.  Eyes:     Extraocular Movements: EOM normal.     Pupils: Pupils are equal, round, and reactive to light.  Cardiovascular:     Rate and Rhythm: Normal rate and regular rhythm.  Pulmonary:     Effort: Pulmonary effort is normal. No respiratory distress.  Skin:    General: Skin is warm and dry.  Neurological:     Mental Status: She is alert and oriented to person, place, and time.  Psychiatric:        Mood and Affect: Mood and affect normal.        Behavior: Behavior normal.        Thought Content: Thought content normal.        Judgment: Judgment normal.     Assessment/Plan:     75 yo here fore urine sample collection related to hematuria.  Will send microscopic urine specimen.  Reviewed options for prolapse management  More than 5 minutes were spent face to face with the patient in the room, reviewing the medical record, labs and images, and coordinating care for the patient. The plan of management  was discussed in detail and counseling was provided.    Adelene Idler MD [facility], [facility] 08/11/2020 9:32 AM

## 2020-08-12 LAB — URINALYSIS, MICROSCOPIC ONLY
Bacteria, UA: NONE SEEN
Casts: NONE SEEN /LPF
RBC, Urine: NONE SEEN /HPF (ref 0–2)
WBC, UA: NONE SEEN /HPF (ref 0–5)

## 2020-08-28 DIAGNOSIS — H9211 Otorrhea, right ear: Secondary | ICD-10-CM | POA: Diagnosis not present

## 2020-08-28 DIAGNOSIS — H66001 Acute suppurative otitis media without spontaneous rupture of ear drum, right ear: Secondary | ICD-10-CM | POA: Diagnosis not present

## 2020-08-28 DIAGNOSIS — H6981 Other specified disorders of Eustachian tube, right ear: Secondary | ICD-10-CM | POA: Diagnosis not present

## 2020-09-08 DIAGNOSIS — H66009 Acute suppurative otitis media without spontaneous rupture of ear drum, unspecified ear: Secondary | ICD-10-CM | POA: Diagnosis not present

## 2020-09-08 DIAGNOSIS — H6983 Other specified disorders of Eustachian tube, bilateral: Secondary | ICD-10-CM | POA: Diagnosis not present

## 2020-09-08 DIAGNOSIS — H9210 Otorrhea, unspecified ear: Secondary | ICD-10-CM | POA: Diagnosis not present

## 2020-09-08 DIAGNOSIS — H6981 Other specified disorders of Eustachian tube, right ear: Secondary | ICD-10-CM | POA: Diagnosis not present

## 2020-09-11 DIAGNOSIS — Z0100 Encounter for examination of eyes and vision without abnormal findings: Secondary | ICD-10-CM | POA: Diagnosis not present

## 2020-09-15 DIAGNOSIS — E119 Type 2 diabetes mellitus without complications: Secondary | ICD-10-CM | POA: Diagnosis not present

## 2020-09-22 ENCOUNTER — Ambulatory Visit (INDEPENDENT_AMBULATORY_CARE_PROVIDER_SITE_OTHER): Payer: Medicare HMO | Admitting: Obstetrics and Gynecology

## 2020-09-22 ENCOUNTER — Encounter: Payer: Self-pay | Admitting: Obstetrics and Gynecology

## 2020-09-22 ENCOUNTER — Other Ambulatory Visit: Payer: Self-pay

## 2020-09-22 VITALS — BP 120/70 | Ht 62.4 in | Wt 140.4 lb

## 2020-09-22 DIAGNOSIS — N8110 Cystocele, unspecified: Secondary | ICD-10-CM

## 2020-09-22 DIAGNOSIS — Z0000 Encounter for general adult medical examination without abnormal findings: Secondary | ICD-10-CM | POA: Diagnosis not present

## 2020-09-22 DIAGNOSIS — E7800 Pure hypercholesterolemia, unspecified: Secondary | ICD-10-CM | POA: Diagnosis not present

## 2020-09-22 DIAGNOSIS — E119 Type 2 diabetes mellitus without complications: Secondary | ICD-10-CM | POA: Diagnosis not present

## 2020-09-22 DIAGNOSIS — M2550 Pain in unspecified joint: Secondary | ICD-10-CM | POA: Diagnosis not present

## 2020-09-22 NOTE — Progress Notes (Signed)
Patient ID: Kari Mcgee, female   DOB: [birth year], 75 y.o.   MRN: [id number]  Reason for Consult: Gynecologic Exam (Surgery Consult)   Referred by Baxter Hire, MD  Subjective:     HPI:  Kari Mcgee is a 75 y.o. female. She reports her prolapse has been increasingly bothersome. She works as a Scientist, water quality. Picking up things and standing throughout the day tends to make her symptoms worse.   She has not seen any further hematuria or postmenopausal bleeding.    History reviewed. No pertinent past medical history. Family History  Problem Relation Age of Onset  . Heart failure Mother   . Breast cancer Neg Hx    History reviewed. No pertinent surgical history.  Short Social History:  Social History   Tobacco Use  . Smoking status: Never Smoker  . Smokeless tobacco: Never Used  Substance Use Topics  . Alcohol use: Never    No Known Allergies  Current Outpatient Medications  Medication Sig Dispense Refill  . fluticasone (FLONASE) 50 MCG/ACT nasal spray SPRAY 2 SPRAYS INTO EACH NOSTRIL EVERY DAY    . pantoprazole (PROTONIX) 40 MG tablet     . SHINGRIX injection     . simvastatin (ZOCOR) 20 MG tablet      No current facility-administered medications for this visit.    Review of Systems  Constitutional: Negative for chills, fatigue, fever and unexpected weight change.  HENT: Negative for trouble swallowing.  Eyes: Negative for loss of vision.  Respiratory: Negative for cough, shortness of breath and wheezing.  Cardiovascular: Negative for chest pain, leg swelling, palpitations and syncope.  GI: Negative for abdominal pain, blood in stool, diarrhea, nausea and vomiting.  GU: Negative for difficulty urinating, dysuria, frequency and hematuria.  Musculoskeletal: Negative for back pain, leg pain and joint pain.  Skin: Negative for rash.  Neurological: Negative for dizziness, headaches, light-headedness, numbness and seizures.  Psychiatric: Negative for behavioral  problem, confusion, depressed mood and sleep disturbance.        Objective:  Objective   Vitals:   09/22/20 0829  BP: 120/70  Weight: 140 lb 6.4 oz (63.7 kg)  Height: 5' 2.4" (1.585 m)   Body mass index is 25.35 kg/m.  Physical Exam Vitals and nursing note reviewed.  Constitutional:      Appearance: She is well-developed and well-nourished.  HENT:     Head: Normocephalic and atraumatic.  Eyes:     Extraocular Movements: EOM normal.     Pupils: Pupils are equal, round, and reactive to light.  Cardiovascular:     Rate and Rhythm: Normal rate and regular rhythm.  Pulmonary:     Effort: Pulmonary effort is normal. No respiratory distress.  Skin:    General: Skin is warm and dry.  Neurological:     Mental Status: She is alert and oriented to person, place, and time.  Psychiatric:        Mood and Affect: Mood and affect normal.        Behavior: Behavior normal.        Thought Content: Thought content normal.        Judgment: Judgment normal.     Assessment/Plan:     75 yo with stage 2 cystocele and apical prolapse Reviewed options for pessary versus surgery. She prefers surgical management She does not want to come to the doctor's office for maintenance of a pessary.  Will refer to Urogynecology for consultation. She wishes to have additional information regarding cost  of this procedure as well. I have provided her previously with AUGS handouts about prolapse and surgery. She reports she still has them at home.    More than 20 minutes were spent face to face with the patient in the room, reviewing the medical record, labs and images, and coordinating care for the patient. The plan of management was discussed in detail and counseling was provided.     Adrian Prows MD [facility], [facility] 09/22/2020 8:40 AM

## 2020-10-20 DIAGNOSIS — I878 Other specified disorders of veins: Secondary | ICD-10-CM | POA: Diagnosis not present

## 2020-10-20 DIAGNOSIS — R112 Nausea with vomiting, unspecified: Secondary | ICD-10-CM | POA: Diagnosis not present

## 2020-10-20 DIAGNOSIS — R197 Diarrhea, unspecified: Secondary | ICD-10-CM | POA: Diagnosis not present

## 2020-10-20 DIAGNOSIS — M19042 Primary osteoarthritis, left hand: Secondary | ICD-10-CM | POA: Diagnosis not present

## 2020-10-20 DIAGNOSIS — H6691 Otitis media, unspecified, right ear: Secondary | ICD-10-CM | POA: Diagnosis not present

## 2020-10-20 DIAGNOSIS — Z03818 Encounter for observation for suspected exposure to other biological agents ruled out: Secondary | ICD-10-CM | POA: Diagnosis not present

## 2020-10-20 DIAGNOSIS — R519 Headache, unspecified: Secondary | ICD-10-CM | POA: Diagnosis not present

## 2020-10-20 DIAGNOSIS — M19041 Primary osteoarthritis, right hand: Secondary | ICD-10-CM | POA: Diagnosis not present

## 2020-11-03 NOTE — Progress Notes (Signed)
[facility] New Patient Evaluation and Consultation  Referring Provider: Homero Fellers, * PCP: Baxter Hire, MD Date of Service: 11/05/2020  SUBJECTIVE Chief Complaint: New Patient (Initial Visit)  History of Present Illness: Kari Mcgee is a 75 y.o. White or Caucasian female seen in consultation at the request of Dr. Gilman Schmidt for evaluation of prolapse.    Review of records significant for: Prolapse has been worsening, especially with working and picking things up throughout the day. Cystocele and apical prolapse noted on pelvic exam. Prefers surgical management.   Urinary Symptoms: Does not leak urine.   Day time voids 3.  Nocturia: 1 times per night to void. Voiding dysfunction: she empties her bladder well.  does not use a catheter to empty bladder.  When urinating, she feels she has no difficulties, but sometimes will have to push up the prolapse to help with the pain.   UTIs: 0 UTI's in the last year.   Denies history of blood in urine and kidney or bladder stones  Pelvic Organ Prolapse Symptoms:                  She Admits to a feeling of a bulge the vaginal area. It has been present for 1 year.  She Admits to seeing a bulge.  This bulge is bothersome. Worsens with standing all day at work.  Has not had any prior treatment.   Bowel Symptom: Bowel movements: 2-3 time(s) per day, depends on what she is eating Stool consistency: soft  Straining: yes.  Splinting: yes.  Incomplete evacuation: no.  She Denies accidental bowel leakage / fecal incontinence Bowel regimen: none Last colonoscopy: Date Nov 2021, Results negative  Sexual Function Sexually active: no.   Pelvic Pain Admits to pelvic pain- when she has to urinate, usually when the bladder has dropped.    Past Medical History:  Past Medical History:  Diagnosis Date  . Diabetes ([place])   . GERD (gastroesophageal reflux disease)   . Hypertension   Had elevated A1c to 6.6 on  03/13/20  Past Surgical History:  History reviewed. No pertinent surgical history.   Past OB/GYN History: OB History  Gravida Para Term Preterm AB Living  1 1 1     1   SAB IAB Ectopic Multiple Live Births          1    # Outcome Date GA Lbr Len/2nd Weight Sex Delivery Anes PTL Lv  1 Term 06/27/64    M    LIV  NSVD x1  Menopausal: Yes, at age 50s, Denies vaginal bleeding since menopause Last pap smear was Nov 2021.  Any history of abnormal pap smears: no.   Medications: She has a current medication list which includes the following prescription(s): fluticasone, pantoprazole, simvastatin, and shingrix.   Allergies: Patient has No Known Allergies.   Social History:  Social History   Tobacco Use  . Smoking status: Never Smoker  . Smokeless tobacco: Never Used  Vaping Use  . Vaping Use: Never used  Substance Use Topics  . Alcohol use: Never  . Drug use: Never    Relationship status: married She lives with husband.   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 Regular exercise: No History of abuse: No  Family History:   Family History  Problem Relation Age of Onset  . Heart failure Mother   . Breast cancer Neg Hx      Review of Systems: Review of Systems  Constitutional: Negative  for fever, malaise/fatigue and weight loss.  Respiratory: Negative for cough, shortness of breath and wheezing.   Cardiovascular: Negative for chest pain, palpitations and leg swelling.  Gastrointestinal: Negative for abdominal pain and blood in stool.  Genitourinary: Negative for dysuria.  Musculoskeletal: Negative for myalgias.  Skin: Negative for rash.  Neurological: Negative for dizziness and headaches.  Endo/Heme/Allergies: Does not bruise/bleed easily.  Psychiatric/Behavioral: Negative for depression. The patient is not nervous/anxious.      OBJECTIVE Physical Exam: Vitals:   11/05/20 0935  BP: 135/74  Pulse: 68  Weight: 139 lb (63 kg)  Height: 5\' 2"  (1.575 m)     Physical Exam Constitutional:      General: She is not in acute distress. Pulmonary:     Effort: Pulmonary effort is normal.  Abdominal:     General: There is no distension.     Palpations: Abdomen is soft.     Tenderness: There is no abdominal tenderness. There is no rebound.  Musculoskeletal:        General: No swelling. Normal range of motion.  Skin:    General: Skin is warm and dry.     Findings: No rash.  Neurological:     Mental Status: She is alert and oriented to person, place, and time.  Psychiatric:        Mood and Affect: Mood normal.        Behavior: Behavior normal.      GU / Detailed Urogynecologic Evaluation:  Pelvic Exam: Normal external female genitalia; Bartholin's and Skene's glands normal in appearance; urethral meatus normal in appearance, no urethral masses or discharge.   CST: negative  Speculum exam reveals normal vaginal mucosa with atrophy. Cervix normal appearance. Uterus normal single, nontender. Adnexa no mass, fullness, tenderness.     With apex supported, anterior compartment defect was reduced  Pelvic floor strength I/V  Pelvic floor musculature: Right levator non-tender, Right obturator non-tender, Left levator non-tender, Left obturator non-tender  POP-Q:   POP-Q  2                                            Aa   2                                           Ba  -6                                              C   5                                            Gh  3                                            Pb  8  tvl   -2                                            Ap  -2                                            Bp  -7                                              D     Rectal Exam:  Normal external rectum  Post-Void Residual (PVR) by Bladder Scan: In order to evaluate bladder emptying, we discussed obtaining a postvoid residual and she agreed to this  procedure.  Procedure: The ultrasound unit was placed on the patient's abdomen in the suprapubic region after the patient had voided. A PVR of 20 ml was obtained by bladder scan.  Laboratory Results: POC urine: negative   I visualized the urine specimen, noting the specimen to be clear yellow  ASSESSMENT AND PLAN [name] is a 75 y.o. with:  1. Prolapse of anterior vaginal wall   2. Uterovaginal prolapse, incomplete   3. Urinary frequency    Stage III anterior, Stage I posterior, Stage I apical prolapse For treatment of pelvic organ prolapse, we discussed options for management including expectant management, conservative management, and surgical management, such as Kegels, a pessary, pelvic floor physical therapy, and specific surgical procedures. - We discussed two options for prolapse repair:  1) vaginal repair without mesh- with or without hysterectomy - Pros - safer, no mesh complications - Cons - not as strong as mesh repair, higher risk of recurrence  2) laparoscopic repair with mesh - Pros - stronger, better long-term success - Cons - risks of mesh implant (erosion into vagina or bladder, adhering to the rectum, pain) - these risks are lower than with a vaginal mesh but still exist  - Handouts were provided about her options and she will consider and let us know what she decides.  - We discussed she will need simple CMG testing prior to scheduling surgery to assess for occult incontinence   Jaquita Folds, MD   Medical Decision Making:  - Reviewed/ ordered a clinical laboratory test - Review and summation of prior records

## 2020-11-05 ENCOUNTER — Ambulatory Visit (INDEPENDENT_AMBULATORY_CARE_PROVIDER_SITE_OTHER): Payer: Medicare HMO | Admitting: Obstetrics and Gynecology

## 2020-11-05 ENCOUNTER — Other Ambulatory Visit: Payer: Self-pay

## 2020-11-05 ENCOUNTER — Encounter: Payer: Self-pay | Admitting: Obstetrics and Gynecology

## 2020-11-05 VITALS — BP 135/74 | HR 68 | Ht 62.0 in | Wt 139.0 lb

## 2020-11-05 DIAGNOSIS — N8110 Cystocele, unspecified: Secondary | ICD-10-CM

## 2020-11-05 DIAGNOSIS — N812 Incomplete uterovaginal prolapse: Secondary | ICD-10-CM | POA: Diagnosis not present

## 2020-11-05 DIAGNOSIS — R350 Frequency of micturition: Secondary | ICD-10-CM | POA: Diagnosis not present

## 2020-11-05 LAB — POCT URINALYSIS DIPSTICK
Appearance: NORMAL
Bilirubin, UA: NEGATIVE
Blood, UA: NEGATIVE
Glucose, UA: NEGATIVE
Ketones, UA: NEGATIVE
Leukocytes, UA: NEGATIVE
Nitrite, UA: NEGATIVE
Protein, UA: NEGATIVE
Spec Grav, UA: 1.015 (ref 1.010–1.025)
Urobilinogen, UA: 0.2 E.U./dL
pH, UA: 7 (ref 5.0–8.0)

## 2020-11-05 NOTE — Patient Instructions (Addendum)
You have a stage 3 (out of 4) prolapse.  We discussed the fact that it is not life threatening but there are several treatment options. For treatment of pelvic organ prolapse, we discussed options for management including expectant management, conservative management, and surgical management, such as Kegels, a pessary, pelvic floor physical therapy, and specific surgical procedures.      

## 2021-01-27 ENCOUNTER — Telehealth: Payer: Self-pay | Admitting: Obstetrics and Gynecology

## 2021-02-03 NOTE — Telephone Encounter (Signed)
DOB verified. Spoke with pt who is wanting to go forward with procedure she had previously discussed with Dr. Wannetta Sender. Advised that per Dr. Tommas Olp last appt note, she would need to have a simple CMG procedure done prior to scheduling surgery. Pt was given appt for this. Advised to call us back with any other questions. Pt verbalized understanding.

## 2021-02-22 NOTE — Progress Notes (Signed)
[facility] Return Visit  SUBJECTIVE  History of Present Illness: Kari Mcgee is a 75 y.o. female seen in follow-up for prolapse. She presents today for simple CMG procedure. She feels that the prolapse has been getting worse and she is definitely interested in surgery.    Past Medical History: Patient  has a past medical history of Diabetes ([place]), GERD (gastroesophageal reflux disease), and Hypertension.   Past Surgical History: She  has no past surgical history on file.   Medications: She has a current medication list which includes the following prescription(s): fluticasone, pantoprazole, shingrix, and simvastatin.   Allergies: Patient has No Known Allergies.   Social History: Patient  reports that she has never smoked. She has never used smokeless tobacco. She reports that she does not drink alcohol and does not use drugs.      OBJECTIVE     Physical Exam: Vitals:   02/23/21 1012  BP: 136/74  Pulse: 69   Gen: No apparent distress, A&O x 3.  Detailed Urogynecologic Evaluation:  Normal external genitalia and urethra.   POP-Q  3                                            Aa   3.5                                           Ba  4                                              C   5                                            Gh  2.5                                            Pb  7                                            tvl   3                                            Ap  4                                            Bp  -4  D   Verbal consent was obtained to perform simple CMG procedure:   Prolapse was reduced using 2 large cotton swabs. Urethra was prepped with betadine and a 75F catheter was placed and bladder was drained completely. The bladder was then backfilled with sterile water by gravity.  First sensation: 160 First Desire: 200 Strong Desire: 210 Capacity: 325 Cough stress test  was positive in the standing position, negative in sitting position. Valsalva stress test was negative.  She was was allowed to void on her own and voided 349ml.   Interpretation: CMG showed normal sensation, and normal cystometric capacity. Findings positive for stress incontinence, negative for detrusor overactivity.       ASSESSMENT AND PLAN    [name] is a 75 y.o. with:  1. Uterovaginal prolapse, incomplete   2. Prolapse of anterior vaginal wall   3. Prolapse of posterior vaginal wall   4. SUI (stress urinary incontinence, female)    - We reviewed options of vaginal vs robotic surgery and will send handouts to her home to discuss at the next visit.  - With the simple CMG, would recommend anti-incontinence procedure at the same time as prolapse repair.  - Return for pre-op discussion.   Jaquita Folds, MD  Time spent: I spent 15 minutes dedicated to the care of this patient on the date of this encounter to include pre-visit review of records, face-to-face time with the patient discussing options for surgery and post visit documentation. Additional time was spent for the procedure.

## 2021-02-23 ENCOUNTER — Other Ambulatory Visit: Payer: Self-pay

## 2021-02-23 ENCOUNTER — Encounter: Payer: Self-pay | Admitting: Obstetrics and Gynecology

## 2021-02-23 ENCOUNTER — Ambulatory Visit (INDEPENDENT_AMBULATORY_CARE_PROVIDER_SITE_OTHER): Payer: Medicare HMO | Admitting: Obstetrics and Gynecology

## 2021-02-23 VITALS — BP 136/74 | HR 69

## 2021-02-23 DIAGNOSIS — N393 Stress incontinence (female) (male): Secondary | ICD-10-CM | POA: Diagnosis not present

## 2021-02-23 DIAGNOSIS — N8110 Cystocele, unspecified: Secondary | ICD-10-CM

## 2021-02-23 DIAGNOSIS — N816 Rectocele: Secondary | ICD-10-CM | POA: Diagnosis not present

## 2021-02-23 DIAGNOSIS — N812 Incomplete uterovaginal prolapse: Secondary | ICD-10-CM | POA: Diagnosis not present

## 2021-03-09 DIAGNOSIS — J019 Acute sinusitis, unspecified: Secondary | ICD-10-CM | POA: Diagnosis not present

## 2021-03-09 DIAGNOSIS — J301 Allergic rhinitis due to pollen: Secondary | ICD-10-CM | POA: Diagnosis not present

## 2021-03-09 DIAGNOSIS — H7201 Central perforation of tympanic membrane, right ear: Secondary | ICD-10-CM | POA: Diagnosis not present

## 2021-03-10 NOTE — Progress Notes (Signed)
[facility] Return Visit  SUBJECTIVE  History of Present Illness: Kari Mcgee is a 75 y.o. female seen in follow-up for prolapse and incontinence. Plan at last visit was to review surgical options. She is interested in vaginal repair.   Simple CMG showed: Interpretation: CMG showed normal sensation, and normal cystometric capacity. Findings positive for stress incontinence, negative for detrusor overactivity.  Past Medical History: Patient  has a past medical history of Diabetes (Kari Mcgee), GERD (gastroesophageal reflux disease), and Hypertension.   Past Surgical History: She  has no past surgical history on file.   Medications: She has a current medication list which includes the following prescription(s): fluticasone, pantoprazole, and simvastatin.   Allergies: Patient has No Known Allergies.   Social History: Patient  reports that she has never smoked. She has never used smokeless tobacco. She reports that she does not drink alcohol and does not use drugs.      OBJECTIVE     Physical Exam: Vitals:   03/11/21 0910  BP: 128/76  Pulse: 68  Weight: 137 lb (62.1 kg)   Gen: No apparent distress, A&O x 3.  Detailed Urogynecologic Evaluation:  Deferred. Prior exam showed (02/23/21):   POP-Q   3                                            Aa   3.5                                           Ba   4                                              C    5                                            Gh   2.5                                            Pb   7                                            tvl    3                                            Ap   4                                            Bp   -4  D      ASSESSMENT AND PLAN    [name] is a 75 y.o. with:  1. Uterovaginal prolapse, incomplete   2. Prolapse of anterior vaginal wall   3. Prolapse of posterior vaginal wall   4. SUI (stress urinary  incontinence, female)     Plan for surgery: Exam under anesthesia, total vaginal hysterectomy, bilateral salpingo-oophorectomy, uterosacral ligament suspension, anterior and posterior repair, midurethral sling, cystoscopy  - We reviewed the patient's specific anatomic and functional findings, with the assistance of diagrams, and together finalized the above procedure. The planned surgical procedures were discussed along with the surgical risks outlined below, which were also provided on a detailed handout.   General Surgical Risks: For all procedures, there are risks of bleeding, infection, damage to surrounding organs including but not limited to bowel, bladder, blood vessels, ureters and nerves, and need for further surgery if an injury were to occur. These risks are all low with minimally invasive surgery.   There are risks of numbness and weakness at any body site or buttock/rectal pain.  It is possible that baseline pain can be worsened by surgery, either with or without mesh. If surgery is vaginal, there is also a low risk of possible conversion to laparoscopy or open abdominal incision where indicated. Very rare risks include blood transfusion, blood clot, heart attack, pneumonia, or death.   There is also a risk of short-term postoperative urinary retention with need to use a catheter. About half of patients need to go home from surgery with a catheter, which is then later removed in the office. The risk of long-term need for a catheter is very low. There is also a risk of worsening of overactive bladder.   Sling: The effectiveness of a midurethral vaginal mesh sling is approximately 85%, and thus, there will be times when you may leak urine after surgery, especially if your bladder is full or if you have a strong cough. There is a balance between making the sling tight enough to treat your leakage but not too tight so that you have long-term difficulty emptying your bladder. A mesh sling will  not directly treat overactive bladder/urge incontinence and may worsen it.  There is an FDA safety notification on vaginal mesh procedures for prolapse but NOT mesh slings. We have extensive experience and training with mesh placement and we have close postoperative follow up to identify any potential complications from mesh. It is important to realize that this mesh is a permanent implant that cannot be easily removed. There are rare risks of mesh exposure (2-4%), pain with intercourse (0-7%), and infection (<1%). The risk of mesh exposure if more likely in a woman with risks for poor healing (prior radiation, poorly controlled diabetes, or immunocompromised). The risk of new or worsened chronic pain after mesh implant is more common in women with baseline chronic pain and/or poorly controlled anxiety or depression. Approximately 2-4% of patients will experience longer-term post-operative voiding dysfunction that may require surgical revision of the sling. We also reviewed that postoperatively, her stream may not be as strong as before surgery.   Prolapse (with or without mesh): Risk factors for surgical failure  include things that put pressure on your pelvis and the surgical repair, including obesity, chronic cough, and heavy lifting or straining (including lifting children or adults, straining on the toilet, or lifting heavy objects such as furniture or anything weighing >25 lbs. Risks of recurrence is 20-30% with vaginal native tissue repair and a less than 10%  with sacrocolpopexy with mesh.     - For preop Visit:  She is required to have a visit within 30 days of her surgery.   Today we reviewed pre-operative preparation, peri-operative expectations, and post-operative instructions/recovery.    - Medical clearance: not required A1c 6.6 on 03/13/20 - Anticoagulant use: No - Medicaid Hysterectomy form: No - Accepts blood transfusion: Yes - Expected length of stay: outpatient  Request sent for  surgery scheduling.   Kari Folds, MD  Time spent: I spent 25 minutes dedicated to the care of this patient on the date of this encounter to include pre-visit review of records, face-to-face time with the patient discussing surgery and post visit documentation.

## 2021-03-11 ENCOUNTER — Other Ambulatory Visit: Payer: Self-pay

## 2021-03-11 ENCOUNTER — Encounter: Payer: Self-pay | Admitting: Obstetrics and Gynecology

## 2021-03-11 ENCOUNTER — Ambulatory Visit (INDEPENDENT_AMBULATORY_CARE_PROVIDER_SITE_OTHER): Payer: Medicare HMO | Admitting: Obstetrics and Gynecology

## 2021-03-11 VITALS — BP 128/76 | HR 68 | Wt 137.0 lb

## 2021-03-11 DIAGNOSIS — N393 Stress incontinence (female) (male): Secondary | ICD-10-CM

## 2021-03-11 DIAGNOSIS — N8110 Cystocele, unspecified: Secondary | ICD-10-CM | POA: Diagnosis not present

## 2021-03-11 DIAGNOSIS — N812 Incomplete uterovaginal prolapse: Secondary | ICD-10-CM | POA: Diagnosis not present

## 2021-03-11 DIAGNOSIS — N816 Rectocele: Secondary | ICD-10-CM | POA: Diagnosis not present

## 2021-03-18 DIAGNOSIS — E119 Type 2 diabetes mellitus without complications: Secondary | ICD-10-CM | POA: Diagnosis not present

## 2021-03-19 DIAGNOSIS — J301 Allergic rhinitis due to pollen: Secondary | ICD-10-CM | POA: Diagnosis not present

## 2021-03-19 DIAGNOSIS — H7201 Central perforation of tympanic membrane, right ear: Secondary | ICD-10-CM | POA: Diagnosis not present

## 2021-03-25 DIAGNOSIS — Z0001 Encounter for general adult medical examination with abnormal findings: Secondary | ICD-10-CM | POA: Diagnosis not present

## 2021-03-25 DIAGNOSIS — E7800 Pure hypercholesterolemia, unspecified: Secondary | ICD-10-CM | POA: Diagnosis not present

## 2021-03-25 DIAGNOSIS — Z1211 Encounter for screening for malignant neoplasm of colon: Secondary | ICD-10-CM | POA: Diagnosis not present

## 2021-03-25 DIAGNOSIS — K219 Gastro-esophageal reflux disease without esophagitis: Secondary | ICD-10-CM | POA: Diagnosis not present

## 2021-03-25 DIAGNOSIS — E119 Type 2 diabetes mellitus without complications: Secondary | ICD-10-CM | POA: Diagnosis not present

## 2021-04-05 DIAGNOSIS — Z1211 Encounter for screening for malignant neoplasm of colon: Secondary | ICD-10-CM | POA: Diagnosis not present

## 2021-04-11 LAB — COLOGUARD: COLOGUARD: NEGATIVE

## 2021-04-15 ENCOUNTER — Telehealth: Payer: Self-pay | Admitting: Obstetrics and Gynecology

## 2021-04-20 ENCOUNTER — Other Ambulatory Visit: Payer: Self-pay | Admitting: Obstetrics and Gynecology

## 2021-04-24 ENCOUNTER — Ambulatory Visit (INDEPENDENT_AMBULATORY_CARE_PROVIDER_SITE_OTHER): Payer: Medicare HMO | Admitting: Obstetrics and Gynecology

## 2021-04-24 ENCOUNTER — Other Ambulatory Visit: Payer: Self-pay

## 2021-04-24 ENCOUNTER — Encounter: Payer: Self-pay | Admitting: Obstetrics and Gynecology

## 2021-04-24 VITALS — BP 138/81 | HR 72 | Wt 137.0 lb

## 2021-04-24 DIAGNOSIS — N393 Stress incontinence (female) (male): Secondary | ICD-10-CM

## 2021-04-24 DIAGNOSIS — N8110 Cystocele, unspecified: Secondary | ICD-10-CM

## 2021-04-24 DIAGNOSIS — N816 Rectocele: Secondary | ICD-10-CM

## 2021-04-24 DIAGNOSIS — N812 Incomplete uterovaginal prolapse: Secondary | ICD-10-CM

## 2021-04-24 NOTE — Progress Notes (Signed)
[facility]gyne[facility] Pre-Operative visit  Subjective Chief Complaint: Kari Mcgee presents for a preoperative encounter.   History of Present Illness: Kari Mcgee is a 75 y.o. female who presents for preoperative visit.  She is scheduled to undergo Exam under anesthesia, total vaginal hysterectomy, bilateral salpingo-oophorectomy, uterosacral ligament suspension, anterior and posterior repair, midurethral sling, cystoscopy on 05/11/21.  Her symptoms include vaginal bulge and SUI, and she was was found to have Stage III anterior, Stage III posterior, Stage III apical prolapse  Simple CMG showed: Interpretation: CMG showed normal sensation, and normal cystometric capacity. Findings positive for stress incontinence, negative for detrusor overactivity.   Past Medical History:  Diagnosis Date   Diabetes ([place])    GERD (gastroesophageal reflux disease)    Hypertension      No past surgical history on file.  has No Known Allergies.   Family History  Problem Relation Age of Onset   Heart failure Mother    Breast cancer Neg Hx     Social History   Tobacco Use   Smoking status: Never   Smokeless tobacco: Never  Vaping Use   Vaping Use: Never used  Substance Use Topics   Alcohol use: Never   Drug use: Never     Review of Systems was negative for a full 10 system review except as noted in the History of Present Illness.   Current Outpatient Medications:    fluticasone (FLONASE) 50 MCG/ACT nasal spray, SPRAY 2 SPRAYS INTO EACH NOSTRIL EVERY DAY, Disp: , Rfl:    pantoprazole (PROTONIX) 40 MG tablet, , Disp: , Rfl:    simvastatin (ZOCOR) 20 MG tablet, , Disp: , Rfl:    Objective Vitals:   04/24/21 1029  BP: 138/81  Pulse: 72     Previous Pelvic Exam showed: POP-Q   3                                            Aa   3.5                                           Ba   4                                              C    5                                             Gh   2.5                                            Pb   7                                            tvl    3  Ap   4                                            Bp   -4                                              D       Assessment/ Plan  Assessment: The patient is a 75 y.o. year old scheduled to undergo Exam under anesthesia, total vaginal hysterectomy, bilateral salpingo-oophorectomy, uterosacral ligament suspension, anterior and posterior repair, midurethral sling, cystoscopy. Verbal consent was obtained for these procedures.  Plan: General Surgical Consent: The patient has previously been counseled on alternative treatments, and the decision by the patient and provider was to proceed with the procedure listed above.  For all procedures, there are risks of bleeding, infection, damage to surrounding organs including but not limited to bowel, bladder, blood vessels, ureters and nerves, and need for further surgery if an injury were to occur. These risks are all low with minimally invasive surgery.   There are risks of numbness and weakness at any body site or buttock/rectal pain.  It is possible that baseline pain can be worsened by surgery, either with or without mesh. If surgery is vaginal, there is also a low risk of possible conversion to laparoscopy or open abdominal incision where indicated. Very rare risks include blood transfusion, blood clot, heart attack, pneumonia, or death.   There is also a risk of short-term postoperative urinary retention with need to use a catheter. About half of patients need to go home from surgery with a catheter, which is then later removed in the office. The risk of long-term need for a catheter is very low. There is also a risk of worsening of overactive bladder.   Sling: The effectiveness of a midurethral vaginal mesh sling is approximately 85%, and thus, there will be times when you may leak  urine after surgery, especially if your bladder is full or if you have a strong cough. There is a balance between making the sling tight enough to treat your leakage but not too tight so that you have long-term difficulty emptying your bladder. A mesh sling will not directly treat overactive bladder/urge incontinence and may worsen it.  There is an FDA safety notification on vaginal mesh procedures for prolapse but NOT mesh slings. We have extensive experience and training with mesh placement and we have close postoperative follow up to identify any potential complications from mesh. It is important to realize that this mesh is a permanent implant that cannot be easily removed. There are rare risks of mesh exposure (2-4%), pain with intercourse (0-7%), and infection (<1%). The risk of mesh exposure if more likely in a woman with risks for poor healing (prior radiation, poorly controlled diabetes, or immunocompromised). The risk of new or worsened chronic pain after mesh implant is more common in women with baseline chronic pain and/or poorly controlled anxiety or depression. Approximately 2-4% of patients will experience longer-term post-operative voiding dysfunction that may require surgical revision of the sling. We also reviewed that postoperatively, her stream may not be as strong as before surgery.    Prolapse (with or without mesh): Risk factors for surgical failure  include things that put  pressure on your pelvis and the surgical repair, including obesity, chronic cough, and heavy lifting or straining (including lifting children or adults, straining on the toilet, or lifting heavy objects such as furniture or anything weighing >25 lbs. Risks of recurrence is 20-30% with vaginal native tissue repair and a less than 10% with sacrocolpopexy with mesh.      We discussed consent for blood products. Risks for blood transfusion include allergic reactions, other reactions that can affect different body organs  and managed accordingly, transmission of infectious diseases such as HIV or Hepatitis. However, the blood is screened. Patient consents for blood products.  Pre-operative instructions:  She was instructed to not take Aspirin/NSAIDs x 7days prior to surgery. She may continue her '81mg'$  ASA. Antibiotic prophylaxis was ordered as indicated.  Cathter use: Patient will go home with foley if needed after post-operative voiding trial.  Post-operative instructions:  She was provided with specific post-operative instructions, including precautions and signs/symptoms for which we would recommend contacting us, in addition to daytime and after-hours contact phone numbers. This was provided on a handout.   Post-operative medications: Prescriptions for motrin, tylenol, miralax, and oxycodone were sent to her pharmacy. Discussed using ibuprofen and tylenol on a schedule to limit use of narcotics.   Laboratory testing:  We will check labs: CBC and type and screen  Preoperative clearance:  She does not require surgical clearance.    Post-operative follow-up:  A post-operative appointment will be made for 6 weeks from the date of surgery. If she needs a post-operative nurse visit for a voiding trial, that will be set up after she leaves the hospital.    Patient will call the clinic or use MyChart should anything change or any new issues arise.   Jaquita Folds, MD

## 2021-04-25 MED ORDER — OXYCODONE HCL 5 MG PO TABS
5.0000 mg | ORAL_TABLET | ORAL | 0 refills | Status: AC | PRN
Start: 2021-04-25 — End: ?

## 2021-04-25 MED ORDER — IBUPROFEN 600 MG PO TABS: 600.0000 mg | ORAL_TABLET | Freq: Four times a day (QID) | ORAL | 0 refills | Status: AC | PRN

## 2021-04-25 MED ORDER — POLYETHYLENE GLYCOL 3350 17 GM/SCOOP PO POWD
17.0000 g | Freq: Every day | ORAL | 0 refills | Status: AC
Start: 2021-04-25 — End: ?

## 2021-04-25 MED ORDER — ACETAMINOPHEN 500 MG PO TABS: 500.0000 mg | ORAL_TABLET | Freq: Four times a day (QID) | ORAL | 0 refills | Status: AC | PRN

## 2021-04-25 NOTE — Addendum Note (Signed)
Addended by: Jaquita Folds on: 04/25/2021 03:22 PM   Modules accepted: Orders

## 2021-04-25 NOTE — H&P (Signed)
[facility] Pre-Operative H&P  Subjective Chief Complaint: Kari Mcgee presents for a preoperative encounter.   History of Present Illness: Kari Mcgee is a 75 y.o. female who presents for preoperative visit.  She is scheduled to undergo Exam under anesthesia, total vaginal hysterectomy, bilateral salpingo-oophorectomy, uterosacral ligament suspension, anterior and posterior repair, midurethral sling, cystoscopy on 05/11/21.  Her symptoms include vaginal bulge and SUI, and she was was found to have Stage III anterior, Stage III posterior, Stage III apical prolapse  Simple CMG showed: Interpretation: CMG showed normal sensation, and normal cystometric capacity. Findings positive for stress incontinence, negative for detrusor overactivity.   Past Medical History:  Diagnosis Date   Diabetes ([place])    GERD (gastroesophageal reflux disease)    Hypertension      No past surgical history on file.  has No Known Allergies.   Family History  Problem Relation Age of Onset   Heart failure Mother    Breast cancer Neg Hx     Social History   Tobacco Use   Smoking status: Never   Smokeless tobacco: Never  Vaping Use   Vaping Use: Never used  Substance Use Topics   Alcohol use: Never   Drug use: Never     Review of Systems was negative for a full 10 system review except as noted in the History of Present Illness.  No current facility-administered medications for this encounter.  Current Outpatient Medications:    acetaminophen (TYLENOL) 500 MG tablet, Take 1 tablet (500 mg total) by mouth every 6 (six) hours as needed (pain)., Disp: 30 tablet, Rfl: 0   fluticasone (FLONASE) 50 MCG/ACT nasal spray, SPRAY 2 SPRAYS INTO EACH NOSTRIL EVERY DAY, Disp: , Rfl:    ibuprofen (ADVIL) 600 MG tablet, Take 1 tablet (600 mg total) by mouth every 6 (six) hours as needed., Disp: 30 tablet, Rfl: 0   oxyCODONE (OXY IR/ROXICODONE) 5 MG immediate release tablet, Take 1 tablet (5  mg total) by mouth every 4 (four) hours as needed for severe pain., Disp: 10 tablet, Rfl: 0   pantoprazole (PROTONIX) 40 MG tablet, , Disp: , Rfl:    polyethylene glycol powder (GLYCOLAX/MIRALAX) 17 GM/SCOOP powder, Take 17 g by mouth daily. Drink 17g (1 scoop) dissolved in water per day., Disp: 255 g, Rfl: 0   simvastatin (ZOCOR) 20 MG tablet, , Disp: , Rfl:    Objective There were no vitals filed for this visit.    Previous Pelvic Exam showed: POP-Q   3                                            Aa   3.5                                           Ba   4                                              C    5  Gh   2.5                                            Pb   7                                            tvl    3                                            Ap   4                                            Bp   -4                                              D       Assessment/ Plan  Assessment: The patient is a 75 y.o. year old with Stage III POP and SUI  Plan: Exam under anesthesia, total vaginal hysterectomy, bilateral salpingo-oophorectomy, uterosacral ligament suspension, anterior and posterior repair, midurethral sling, cystoscopy  Jaquita Folds, MD

## 2021-05-04 ENCOUNTER — Encounter (HOSPITAL_BASED_OUTPATIENT_CLINIC_OR_DEPARTMENT_OTHER): Payer: Self-pay | Admitting: Obstetrics and Gynecology

## 2021-05-04 ENCOUNTER — Other Ambulatory Visit: Payer: Self-pay

## 2021-05-04 DIAGNOSIS — R32 Unspecified urinary incontinence: Secondary | ICD-10-CM

## 2021-05-04 DIAGNOSIS — N950 Postmenopausal bleeding: Secondary | ICD-10-CM | POA: Diagnosis not present

## 2021-05-04 DIAGNOSIS — N812 Incomplete uterovaginal prolapse: Secondary | ICD-10-CM

## 2021-05-04 DIAGNOSIS — Z01812 Encounter for preprocedural laboratory examination: Secondary | ICD-10-CM | POA: Diagnosis not present

## 2021-05-04 HISTORY — DX: Incomplete uterovaginal prolapse: N81.2

## 2021-05-04 HISTORY — DX: Unspecified urinary incontinence: R32

## 2021-05-04 NOTE — Progress Notes (Signed)
YOU ARE SCHEDULED FOR A COVID TEST ON  05-07-2021   . THIS TEST MUST BE DONE BEFORE SURGERY. GO TO  [facility] @ [place]   PHONE NUMBER [phone].   TURN LEFT AT THE SHIPPING AND RECEIVING SIGN AND LOOK FOR SMALL BLUE POP UP TENT UNDER BUILDING OVERHANG AT BACK OF BUILDING AND REMAIN IN YOUR CAR, THIS IS A DRIVE UP TEST.  AFTER YOUR COVID TEST , PLEASE WEAR A MASK OUT IN PUBLIC AND SOCIAL DISTANCE AND [place] YOUR HANDS FREQUENTLY. PLEASE ASK ALL YOUR CLOSE HOUSEHOLD CONTACT TO WEAR MASK OUT IN PUBLIC AND SOCIAL DISTANCE AND [place] HANDS FREQUENTLY ALSO.      Your procedure is scheduled on 05-11-2021  Report to [facility].   Call this number if you have problems the morning of surgery  :[phone].   OUR ADDRESS IS [place].  WE ARE LOCATED IN [facility].  PLEASE BRING YOUR INSURANCE CARD AND PHOTO ID DAY OF SURGERY.  ONLY ONE PERSON ALLOWED IN FACILITY WAITING AREA.                                     REMEMBER:  DO NOT EAT FOOD, CANDY GUM OR MINTS  AFTER MIDNIGHT .  YOU MAY HAVE CLEAR LIQUIDS FROM MIDNIGHT UNTIL 430 AM NO CLEAR LIQUIDS AFTER  430 AM DAY OF SURGERY.   YOU MAY  BRUSH YOUR TEETH MORNING OF SURGERY AND RINSE YOUR MOUTH OUT, NO CHEWING GUM CANDY OR MINTS.    CLEAR LIQUID DIET   Foods Allowed                                                                     Foods Excluded  Coffee and tea, regular and decaf                             liquids that you cannot  Plain Jell-O any favor except red or purple                                           see through such as: Fruit ices (not with fruit pulp)                                     milk, soups, orange juice  Iced Popsicles                                    All solid food Carbonated beverages, regular and diet                                    Cranberry, grape and apple juices Sports drinks like Gatorade Lightly  seasoned clear  broth or consume(fat free) Sugar  Sample Menu Breakfast                                Lunch                                     Supper Cranberry juice                    Beef broth                            Chicken broth Jell-O                                     Grape juice                           Apple juice Coffee or tea                        Jell-O                                      Popsicle                                                Coffee or tea                        Coffee or tea  _____________________________________________________________________     TAKE THESE MEDICATIONS MORNING OF SURGERY WITH A SIP OF WATER: NONE.  ONE VISITOR IS ALLOWED IN WAITING ROOM ONLY DAY OF SURGERY.  YOU MAY HAVE ANOTHER PERSON SWITCH OUT WITH THE  1  VISITOR IN THE WAITING ROOM DAY OF SURGERY AND A MASK MUST BE WORN IN THE WAITING ROOM.    2 VISITORS  MAY VISIT IN THE EXTENDED RECOVERY ROOM UNTIL 800 PM ONLY 1 VISITOR AGE 68 AND OVER MAY SPEND THE NIGHT AND MUST BE IN EXTENDED RECOVERY ROOM NO LATER THAN 800 PM .   UP TO 2 CHILDREN AGE 80 TO 15 MAY ALSO VISIT IN EXTENDED RECOVERY ROOM ONLY UNTIL 800 PM AND MUST LEAVE BY 800 PM. ALL PERSONS VISITING IN EXTENDED RECOVERY ROOM MUST WEAR A MASK.                                    DO NOT WEAR JEWERLY, MAKE UP. DO NOT WEAR LOTIONS, POWDERS, PERFUMES OR NAIL POLISH. DO NOT SHAVE FOR 48 HOURS PRIOR TO DAY OF SURGERY. MEN MAY SHAVE FACE AND NECK. CONTACTS, GLASSES, OR DENTURES MAY NOT BE WORN TO SURGERY.                                    [facility] IS NOT RESPONSIBLE  FOR ANY BELONGINGS.                                                                    [facility]  [facility] - Preparing for Surgery Before surgery, you can play an important role.  Because skin is not sterile, your skin needs to be as free of germs as possible.  You can reduce the number of germs on your skin by washing with CHG (chlorahexidine gluconate) soap  before surgery.  CHG is an antiseptic cleaner which kills germs and bonds with the skin to continue killing germs even after washing. Please DO NOT use if you have an allergy to CHG or antibacterial soaps.  If your skin becomes reddened/irritated stop using the CHG and inform your nurse when you arrive at Short Stay. Do not shave (including legs and underarms) for at least 48 hours prior to the first CHG shower.  You may shave your face/neck. Please follow these instructions carefully:  1.  Shower with CHG Soap the night before surgery and the  morning of Surgery.  2.  If you choose to wash your hair, wash your hair first as usual with your  normal  shampoo.  3.  After you shampoo, rinse your hair and body thoroughly to remove the  shampoo.                            4.  Use CHG as you would any other liquid soap.  You can apply chg directly  to the skin and wash                      Gently with a scrungie or clean washcloth.  5.  Apply the CHG Soap to your body ONLY FROM THE NECK DOWN.   Do not use on face/ open                           Wound or open sores. Avoid contact with eyes, ears mouth and genitals (private parts).                       Wash face,  Genitals (private parts) with your normal soap.             6.  Wash thoroughly, paying special attention to the area where your surgery  will be performed.  7.  Thoroughly rinse your body with warm water from the neck down.  8.  DO NOT shower/wash with your normal soap after using and rinsing off  the CHG Soap.                9.  Pat yourself dry with a clean towel.            10.  Wear clean pajamas.            11.  Place clean sheets on your bed the night of your first shower and do not  sleep with pets. Day of Surgery : Do not apply any lotions/deodorants the morning of surgery.  Please wear clean clothes to the hospital/surgery center.  FAILURE TO FOLLOW THESE INSTRUCTIONS MAY RESULT IN THE CANCELLATION OF YOUR SURGERY PATIENT  SIGNATURE_________________________________  NURSE SIGNATURE__________________________________  ________________________________________________________________________                                                        QUESTIONS Christena Flake  [facility] PRE OP NURSE PHONE [phone].

## 2021-05-04 NOTE — Progress Notes (Signed)
Spoke w/ via phone for pre-op interview---pt Lab needs dos----    none           Lab results------lab appt 05-07-2021 at 830 am for [facility] t & s COVID test -----05-07-2021 extended recovery Arrive at -------530 am 05-11-2021 NPO after MN NO Solid Food.  Clear liquids from MN until---430 am Med rec completed Medications to take morning of surgery -----none Diabetic medication -----n/a Patient instructed no nail polish to be worn day of surgery Patient instructed to bring photo id and insurance card day of surgery Patient aware to have Driver (ride ) / caregiver   driver nephew howard mabe, caregiver after surgery spouse dennis (spouse cannot drive)  for 24 hours after surgery  Patient Special Instructions -----pt given extended recovery instructions Pre-Op special Istructions -----none Patient verbalized understanding of instructions that were given at this phone interview. Patient denies shortness of breath, chest pain, fever, cough at this phone interview.   Patient denies any history of diabetes or htn that was noted in medical history

## 2021-05-07 ENCOUNTER — Other Ambulatory Visit: Payer: Self-pay | Admitting: Obstetrics and Gynecology

## 2021-05-07 ENCOUNTER — Other Ambulatory Visit: Payer: Self-pay

## 2021-05-07 ENCOUNTER — Encounter (HOSPITAL_COMMUNITY)
Admission: RE | Admit: 2021-05-07 | Discharge: 2021-05-07 | Disposition: A | Discharge status: Home / Self Care | Payer: Medicare HMO | Source: Ambulatory Visit | Attending: Obstetrics and Gynecology | Admitting: Obstetrics and Gynecology

## 2021-05-07 DIAGNOSIS — N950 Postmenopausal bleeding: Secondary | ICD-10-CM | POA: Diagnosis not present

## 2021-05-07 DIAGNOSIS — Z01812 Encounter for preprocedural laboratory examination: Secondary | ICD-10-CM | POA: Diagnosis not present

## 2021-05-07 LAB — SARS CORONAVIRUS 2 (TAT 6-24 HRS): SARS Coronavirus 2: NEGATIVE

## 2021-05-07 LAB — CBC
HCT: 44.3 % (ref 36.0–46.0)
Hemoglobin: 14.4 g/dL (ref 12.0–15.0)
MCH: 30.2 pg (ref 26.0–34.0)
MCHC: 32.5 g/dL (ref 30.0–36.0)
MCV: 92.9 fL (ref 80.0–100.0)
Platelets: 210 10*3/uL (ref 150–400)
RBC: 4.77 MIL/uL (ref 3.87–5.11)
RDW: 12.9 % (ref 11.5–15.5)
WBC: 6.3 10*3/uL (ref 4.0–10.5)
nRBC: 0 % (ref 0.0–0.2)

## 2021-05-07 LAB — BASIC METABOLIC PANEL
Anion gap: 8 (ref 5–15)
BUN: 22 mg/dL (ref 8–23)
CO2: 28 mmol/L (ref 22–32)
Calcium: 9.2 mg/dL (ref 8.9–10.3)
Chloride: 106 mmol/L (ref 98–111)
Creatinine, Ser: 0.83 mg/dL (ref 0.44–1.00)
GFR, Estimated: >60 mL/min (ref >60)
Glucose, Bld: 125 mg/dL — ABNORMAL HIGH (ref 70–99)
Potassium: 4 mmol/L (ref 3.5–5.1)
Sodium: 142 mmol/L (ref 135–145)

## 2021-05-09 NOTE — Anesthesia Preprocedure Evaluation (Addendum)
Anesthesia Evaluation  Patient identified by MRN, date of birth, ID band Patient awake    Reviewed: Allergy & Precautions, H&P , NPO status , Patient's Chart, lab work & pertinent test results  Airway Mallampati: II  TM Distance: >3 FB Neck ROM: Full    Dental no notable dental hx. (+) Teeth Intact, Missing, Dental Advisory Given,    Pulmonary neg pulmonary ROS,    Pulmonary exam normal breath sounds clear to auscultation       Cardiovascular Exercise Tolerance: Good negative cardio ROS Normal cardiovascular exam Rhythm:Regular Rate:Normal     Neuro/Psych negative neurological ROS  negative psych ROS   GI/Hepatic Neg liver ROS, GERD  Medicated and Controlled,  Endo/Other  diabetes, Well Controlled  Renal/GU negative Renal ROS  negative genitourinary   Musculoskeletal negative musculoskeletal ROS (+)   Abdominal   Peds negative pediatric ROS (+)  Hematology negative hematology ROS (+)   Anesthesia Other Findings   Reproductive/Obstetrics negative OB ROS                            Anesthesia Physical Anesthesia Plan  ASA: 3  Anesthesia Plan: General   Post-op Pain Management:    Induction: Intravenous  PONV Risk Score and Plan: 3 and Ondansetron, Treatment may vary due to age or medical condition and Dexamethasone  Airway Management Planned: Oral ETT  Additional Equipment: None  Intra-op Plan:   Post-operative Plan: Extubation in OR  Informed Consent: I have reviewed the patients History and Physical, chart, labs and discussed the procedure including the risks, benefits and alternatives for the proposed anesthesia with the patient or authorized representative who has indicated his/her understanding and acceptance.       Plan Discussed with: Anesthesiologist and CRNA  Anesthesia Plan Comments: (  )       Anesthesia Quick Evaluation

## 2021-05-11 ENCOUNTER — Ambulatory Visit (HOSPITAL_BASED_OUTPATIENT_CLINIC_OR_DEPARTMENT_OTHER)
Admission: RE | Admit: 2021-05-11 | Discharge: 2021-05-12 | Disposition: A | Discharge status: Home / Self Care | Payer: Medicare HMO | Attending: Obstetrics and Gynecology | Admitting: Obstetrics and Gynecology

## 2021-05-11 ENCOUNTER — Encounter (HOSPITAL_BASED_OUTPATIENT_CLINIC_OR_DEPARTMENT_OTHER)
Admission: RE | Disposition: A | Discharge status: Home / Self Care | Payer: Self-pay | Source: Home / Self Care | Attending: Obstetrics and Gynecology

## 2021-05-11 ENCOUNTER — Ambulatory Visit (HOSPITAL_BASED_OUTPATIENT_CLINIC_OR_DEPARTMENT_OTHER): Payer: Medicare HMO | Admitting: Anesthesiology

## 2021-05-11 ENCOUNTER — Encounter (HOSPITAL_BASED_OUTPATIENT_CLINIC_OR_DEPARTMENT_OTHER): Payer: Self-pay | Admitting: Obstetrics and Gynecology

## 2021-05-11 DIAGNOSIS — N8110 Cystocele, unspecified: Secondary | ICD-10-CM | POA: Diagnosis not present

## 2021-05-11 DIAGNOSIS — N813 Complete uterovaginal prolapse: Secondary | ICD-10-CM | POA: Insufficient documentation

## 2021-05-11 DIAGNOSIS — Z79899 Other long term (current) drug therapy: Secondary | ICD-10-CM | POA: Diagnosis not present

## 2021-05-11 DIAGNOSIS — N816 Rectocele: Secondary | ICD-10-CM | POA: Diagnosis not present

## 2021-05-11 DIAGNOSIS — N393 Stress incontinence (female) (male): Secondary | ICD-10-CM | POA: Insufficient documentation

## 2021-05-11 DIAGNOSIS — Z8249 Family history of ischemic heart disease and other diseases of the circulatory system: Secondary | ICD-10-CM | POA: Diagnosis not present

## 2021-05-11 DIAGNOSIS — E119 Type 2 diabetes mellitus without complications: Secondary | ICD-10-CM | POA: Insufficient documentation

## 2021-05-11 DIAGNOSIS — N888 Other specified noninflammatory disorders of cervix uteri: Secondary | ICD-10-CM | POA: Diagnosis not present

## 2021-05-11 DIAGNOSIS — N736 Female pelvic peritoneal adhesions (postinfective): Secondary | ICD-10-CM | POA: Diagnosis not present

## 2021-05-11 DIAGNOSIS — N879 Dysplasia of cervix uteri, unspecified: Secondary | ICD-10-CM | POA: Diagnosis not present

## 2021-05-11 DIAGNOSIS — D259 Leiomyoma of uterus, unspecified: Secondary | ICD-10-CM | POA: Diagnosis not present

## 2021-05-11 DIAGNOSIS — N812 Incomplete uterovaginal prolapse: Secondary | ICD-10-CM | POA: Diagnosis not present

## 2021-05-11 DIAGNOSIS — N8003 Adenomyosis of the uterus: Secondary | ICD-10-CM | POA: Diagnosis not present

## 2021-05-11 DIAGNOSIS — Z791 Long term (current) use of non-steroidal anti-inflammatories (NSAID): Secondary | ICD-10-CM | POA: Diagnosis not present

## 2021-05-11 DIAGNOSIS — D219 Benign neoplasm of connective and other soft tissue, unspecified: Secondary | ICD-10-CM | POA: Diagnosis not present

## 2021-05-11 DIAGNOSIS — E785 Hyperlipidemia, unspecified: Secondary | ICD-10-CM | POA: Diagnosis not present

## 2021-05-11 HISTORY — PX: BLADDER SUSPENSION: SHX72

## 2021-05-11 HISTORY — PX: VAGINAL PROLAPSE REPAIR: SHX830

## 2021-05-11 HISTORY — PX: VAGINAL HYSTERECTOMY: SHX2639

## 2021-05-11 HISTORY — PX: ANTERIOR AND POSTERIOR REPAIR: SHX5121

## 2021-05-11 LAB — ABO/RH: ABO/RH(D): A POS

## 2021-05-11 LAB — TYPE AND SCREEN
ABO/RH(D): A POS
Antibody Screen: NEGATIVE

## 2021-05-11 SURGERY — HYSTERECTOMY, VAGINAL
Anesthesia: General | Site: Vagina

## 2021-05-11 MED ORDER — MIDAZOLAM HCL 2 MG/2ML IJ SOLN
INTRAMUSCULAR | Status: AC
  Filled 2021-05-11: qty 2

## 2021-05-11 MED ORDER — SCOPOLAMINE 1 MG/3DAYS TD PT72
1.0000 | MEDICATED_PATCH | TRANSDERMAL | Status: DC
  Administered 2021-05-11: 1.5 mg via TRANSDERMAL

## 2021-05-11 MED ORDER — MEPERIDINE HCL 25 MG/ML IJ SOLN: 6.2500 mg | INTRAMUSCULAR | Status: DC | PRN

## 2021-05-11 MED ORDER — CEFAZOLIN SODIUM-DEXTROSE 2-4 GM/100ML-% IV SOLN
INTRAVENOUS | Status: AC
  Filled 2021-05-11: qty 100

## 2021-05-11 MED ORDER — ACETAMINOPHEN 325 MG PO TABS: 650.0000 mg | ORAL_TABLET | ORAL | Status: DC | PRN

## 2021-05-11 MED ORDER — FLUORESCEIN SODIUM 10 % IV SOLN
INTRAVENOUS | Status: AC
  Filled 2021-05-11: qty 5

## 2021-05-11 MED ORDER — SIMETHICONE 80 MG PO CHEW: 80.0000 mg | CHEWABLE_TABLET | Freq: Four times a day (QID) | ORAL | Status: DC | PRN

## 2021-05-11 MED ORDER — KETAMINE HCL 10 MG/ML IJ SOLN
INTRAMUSCULAR | Status: DC | PRN
  Administered 2021-05-11: 25 mg via INTRAVENOUS

## 2021-05-11 MED ORDER — FENTANYL CITRATE (PF) 250 MCG/5ML IJ SOLN
INTRAMUSCULAR | Status: DC | PRN
  Administered 2021-05-11: 100 ug via INTRAVENOUS
  Administered 2021-05-11 (×4): 50 ug via INTRAVENOUS

## 2021-05-11 MED ORDER — LIDOCAINE 20MG/ML (2%) 15 ML SYRINGE OPTIME
INTRAMUSCULAR | Status: DC | PRN
  Administered 2021-05-11: 1.5 mg/kg/h via INTRAVENOUS

## 2021-05-11 MED ORDER — LIDOCAINE-EPINEPHRINE 1 %-1:100000 IJ SOLN
INTRAMUSCULAR | Status: DC | PRN
  Administered 2021-05-11: 20 mL

## 2021-05-11 MED ORDER — DEXAMETHASONE SODIUM PHOSPHATE 10 MG/ML IJ SOLN
INTRAMUSCULAR | Status: DC | PRN
  Administered 2021-05-11: 5 mg via INTRAVENOUS

## 2021-05-11 MED ORDER — PROPOFOL 10 MG/ML IV BOLUS
INTRAVENOUS | Status: AC
  Filled 2021-05-11: qty 20

## 2021-05-11 MED ORDER — ACETAMINOPHEN 325 MG PO TABS: 325.0000 mg | ORAL_TABLET | ORAL | Status: DC | PRN

## 2021-05-11 MED ORDER — ROCURONIUM BROMIDE 10 MG/ML (PF) SYRINGE
PREFILLED_SYRINGE | INTRAVENOUS | Status: AC
  Filled 2021-05-11: qty 10

## 2021-05-11 MED ORDER — POVIDONE-IODINE 10 % EX SWAB: 2.0000 "application " | Freq: Once | CUTANEOUS | Status: DC

## 2021-05-11 MED ORDER — DEXAMETHASONE SODIUM PHOSPHATE 10 MG/ML IJ SOLN
INTRAMUSCULAR | Status: AC
  Filled 2021-05-11: qty 1

## 2021-05-11 MED ORDER — FLUORESCEIN SODIUM 10 % IV SOLN
INTRAVENOUS | Status: DC | PRN
Start: 2021-05-11 — End: 2021-05-11
  Administered 2021-05-11: .25 mL via INTRAVENOUS

## 2021-05-11 MED ORDER — SCOPOLAMINE 1 MG/3DAYS TD PT72
MEDICATED_PATCH | TRANSDERMAL | Status: AC
  Filled 2021-05-11: qty 1

## 2021-05-11 MED ORDER — FENTANYL CITRATE (PF) 250 MCG/5ML IJ SOLN
INTRAMUSCULAR | Status: AC
  Filled 2021-05-11: qty 5

## 2021-05-11 MED ORDER — ACETAMINOPHEN 500 MG PO TABS
1000.0000 mg | ORAL_TABLET | ORAL | Status: AC
  Administered 2021-05-11: 1000 mg via ORAL

## 2021-05-11 MED ORDER — CHLORHEXIDINE GLUCONATE 0.12 % MT SOLN
15.0000 mL | Freq: Once | OROMUCOSAL | Status: DC
Start: 2021-05-11 — End: 2021-05-12

## 2021-05-11 MED ORDER — FENTANYL CITRATE (PF) 100 MCG/2ML IJ SOLN
INTRAMUSCULAR | Status: AC
  Filled 2021-05-11: qty 2

## 2021-05-11 MED ORDER — OXYCODONE HCL 5 MG PO TABS
5.0000 mg | ORAL_TABLET | ORAL | Status: DC | PRN
  Administered 2021-05-11: 5 mg via ORAL

## 2021-05-11 MED ORDER — CEFAZOLIN SODIUM-DEXTROSE 2-4 GM/100ML-% IV SOLN
2.0000 g | INTRAVENOUS | Status: AC
  Administered 2021-05-11: 2 g via INTRAVENOUS

## 2021-05-11 MED ORDER — LIDOCAINE 2% (20 MG/ML) 5 ML SYRINGE
INTRAMUSCULAR | Status: AC
  Filled 2021-05-11: qty 5

## 2021-05-11 MED ORDER — SODIUM CHLORIDE 0.9 % IR SOLN
Status: DC | PRN
  Administered 2021-05-11: 500 mL

## 2021-05-11 MED ORDER — OXYCODONE HCL 5 MG/5ML PO SOLN: 5.0000 mg | Freq: Once | ORAL | Status: DC | PRN

## 2021-05-11 MED ORDER — LACTATED RINGERS IV SOLN: INTRAVENOUS | Status: DC

## 2021-05-11 MED ORDER — PHENAZOPYRIDINE HCL 100 MG PO TABS
200.0000 mg | ORAL_TABLET | ORAL | Status: AC
  Administered 2021-05-11: 200 mg via ORAL

## 2021-05-11 MED ORDER — SIMVASTATIN 20 MG PO TABS
20.0000 mg | ORAL_TABLET | Freq: Every day | ORAL | Status: DC
  Administered 2021-05-11: 20 mg via ORAL
  Filled 2021-05-11: qty 1

## 2021-05-11 MED ORDER — ONDANSETRON HCL 4 MG PO TABS: 4.0000 mg | ORAL_TABLET | Freq: Four times a day (QID) | ORAL | Status: DC | PRN

## 2021-05-11 MED ORDER — KETAMINE HCL 50 MG/5ML IJ SOSY
PREFILLED_SYRINGE | INTRAMUSCULAR | Status: AC
  Filled 2021-05-11: qty 5

## 2021-05-11 MED ORDER — PROPOFOL 10 MG/ML IV BOLUS
INTRAVENOUS | Status: DC | PRN
  Administered 2021-05-11: 120 mg via INTRAVENOUS

## 2021-05-11 MED ORDER — LIDOCAINE 2% (20 MG/ML) 5 ML SYRINGE
INTRAMUSCULAR | Status: DC | PRN
Start: 2021-05-11 — End: 2021-05-11
  Administered 2021-05-11: 100 mg via INTRAVENOUS

## 2021-05-11 MED ORDER — ACETAMINOPHEN 160 MG/5ML PO SOLN: 325.0000 mg | ORAL | Status: DC | PRN

## 2021-05-11 MED ORDER — SODIUM CHLORIDE 0.9 % IR SOLN
Status: DC | PRN
  Administered 2021-05-11 (×3): 1000 mL via INTRAVESICAL

## 2021-05-11 MED ORDER — OXYCODONE HCL 5 MG PO TABS
ORAL_TABLET | ORAL | Status: AC
  Filled 2021-05-11: qty 1

## 2021-05-11 MED ORDER — ONDANSETRON HCL 4 MG/2ML IJ SOLN
INTRAMUSCULAR | Status: AC
  Filled 2021-05-11: qty 2

## 2021-05-11 MED ORDER — FENTANYL CITRATE (PF) 100 MCG/2ML IJ SOLN: 25.0000 ug | INTRAMUSCULAR | Status: DC | PRN

## 2021-05-11 MED ORDER — ROCURONIUM BROMIDE 10 MG/ML (PF) SYRINGE
PREFILLED_SYRINGE | INTRAVENOUS | Status: DC | PRN
  Administered 2021-05-11 (×2): 10 mg via INTRAVENOUS
  Administered 2021-05-11 (×2): 20 mg via INTRAVENOUS
  Administered 2021-05-11: 60 mg via INTRAVENOUS

## 2021-05-11 MED ORDER — ONDANSETRON HCL 4 MG/2ML IJ SOLN
INTRAMUSCULAR | Status: DC | PRN
  Administered 2021-05-11: 4 mg via INTRAVENOUS

## 2021-05-11 MED ORDER — PANTOPRAZOLE SODIUM 40 MG PO TBEC: 40.0000 mg | DELAYED_RELEASE_TABLET | Freq: Two times a day (BID) | ORAL | Status: DC

## 2021-05-11 MED ORDER — MORPHINE SULFATE (PF) 4 MG/ML IV SOLN: 1.0000 mg | INTRAVENOUS | Status: DC | PRN

## 2021-05-11 MED ORDER — LIDOCAINE HCL 2 % IJ SOLN
INTRAMUSCULAR | Status: AC
  Filled 2021-05-11: qty 20

## 2021-05-11 MED ORDER — ONDANSETRON HCL 4 MG/2ML IJ SOLN
4.0000 mg | Freq: Once | INTRAMUSCULAR | Status: DC | PRN
Start: 2021-05-11 — End: 2021-05-12

## 2021-05-11 MED ORDER — OXYCODONE HCL 5 MG PO TABS: 5.0000 mg | ORAL_TABLET | Freq: Once | ORAL | Status: DC | PRN

## 2021-05-11 MED ORDER — MIDAZOLAM HCL 2 MG/2ML IJ SOLN
INTRAMUSCULAR | Status: DC | PRN
  Administered 2021-05-11: 2 mg via INTRAVENOUS

## 2021-05-11 MED ORDER — PHENAZOPYRIDINE HCL 100 MG PO TABS
ORAL_TABLET | ORAL | Status: AC
  Filled 2021-05-11: qty 2

## 2021-05-11 MED ORDER — ACETAMINOPHEN 500 MG PO TABS
ORAL_TABLET | ORAL | Status: AC
  Filled 2021-05-11: qty 2

## 2021-05-11 MED ORDER — ORAL CARE MOUTH RINSE: 15.0000 mL | Freq: Once | OROMUCOSAL | Status: DC

## 2021-05-11 MED ORDER — SUGAMMADEX SODIUM 200 MG/2ML IV SOLN
INTRAVENOUS | Status: DC | PRN
  Administered 2021-05-11: 200 mg via INTRAVENOUS

## 2021-05-11 MED ORDER — ONDANSETRON HCL 4 MG/2ML IJ SOLN
4.0000 mg | Freq: Four times a day (QID) | INTRAMUSCULAR | Status: DC | PRN
  Administered 2021-05-11: 4 mg via INTRAVENOUS

## 2021-05-11 SURGICAL SUPPLY — 52 items
ADH SKN CLS APL DERMABOND .7 (GAUZE/BANDAGES/DRESSINGS) ×2
AGENT HMST KT MTR STRL THRMB (HEMOSTASIS)
APL ESCP 34 STRL LF DISP (HEMOSTASIS)
APPLICATOR SURGIFLO ENDO (HEMOSTASIS) IMPLANT
BLADE SURG 15 STRL LF DISP TIS (BLADE) ×2 IMPLANT
BLADE SURG 15 STRL SS (BLADE) ×3
CATH URET 5FR 28IN OPEN ENDED (CATHETERS) ×1 IMPLANT
COVER MAYO STAND STRL (DRAPES) ×3 IMPLANT
DECANTER SPIKE VIAL GLASS SM (MISCELLANEOUS) ×2 IMPLANT
DERMABOND ADVANCED (GAUZE/BANDAGES/DRESSINGS) ×1
DERMABOND ADVANCED .7 DNX12 (GAUZE/BANDAGES/DRESSINGS) ×2 IMPLANT
DEVICE CAPIO SLIM SINGLE (INSTRUMENTS) IMPLANT
GAUZE 4X4 16PLY ~~LOC~~+RFID DBL (SPONGE) ×6 IMPLANT
GLOVE SURG ENC MOIS LTX SZ6 (GLOVE) ×4 IMPLANT
GLOVE SURG UNDER POLY LF SZ6.5 (GLOVE) ×4 IMPLANT
GOWN STRL REUS W/ TWL LRG LVL3 (GOWN DISPOSABLE) ×2 IMPLANT
GOWN STRL REUS W/TWL LRG LVL3 (GOWN DISPOSABLE) ×15 IMPLANT
GUIDEWIRE ANG ZIPWIRE 038X150 (WIRE) ×1 IMPLANT
HIBICLENS CHG 4% 4OZ (MISCELLANEOUS) ×3 IMPLANT
KIT TURNOVER CYSTO (KITS) ×3 IMPLANT
NDL MAYO 6 CRC TAPER PT (NEEDLE) ×2 IMPLANT
NEEDLE HYPO 22GX1.5 SAFETY (NEEDLE) ×3 IMPLANT
NEEDLE MAYO 6 CRC TAPER PT (NEEDLE) ×3 IMPLANT
NS IRRIG 1000ML POUR BTL (IV SOLUTION) ×3 IMPLANT
PACK VAGINAL WOMENS (CUSTOM PROCEDURE TRAY) ×3 IMPLANT
PAD OB MATERNITY 4.3X12.25 (PERSONAL CARE ITEMS) ×3 IMPLANT
PAD POSITIONING PINK XL (MISCELLANEOUS) ×2 IMPLANT
RETRACTOR LONE STAR DISPOSABLE (INSTRUMENTS) ×3 IMPLANT
RETRACTOR STAY HOOK 5MM (MISCELLANEOUS) ×3 IMPLANT
SET IRRIG Y TYPE TUR BLADDER L (SET/KITS/TRAYS/PACK) ×3 IMPLANT
SPONGE T-LAP 18X36 ~~LOC~~+RFID STR (SPONGE) ×3 IMPLANT
SUCTION FRAZIER HANDLE 10FR (MISCELLANEOUS) ×3
SUCTION TUBE FRAZIER 10FR DISP (MISCELLANEOUS) ×2 IMPLANT
SURGIFLO W/THROMBIN 8M KIT (HEMOSTASIS) IMPLANT
SUT ABS MONO DBL WITH NDL 48IN (SUTURE) IMPLANT
SUT MON AB 2-0 SH 27 (SUTURE) ×2 IMPLANT
SUT PDS PLUS 0 (SUTURE) ×12
SUT PDS PLUS AB 0 CT-2 (SUTURE) IMPLANT
SUT VIC AB 0 CT1 27 (SUTURE) ×15
SUT VIC AB 0 CT1 27XBRD ANBCTR (SUTURE) ×2 IMPLANT
SUT VIC AB 0 CT1 27XBRD ANTBC (SUTURE) IMPLANT
SUT VIC AB 0 CT1 27XCR 8 STRN (SUTURE) ×4 IMPLANT
SUT VIC AB 2-0 SH 27 (SUTURE)
SUT VIC AB 2-0 SH 27XBRD (SUTURE) IMPLANT
SUT VICRYL 2-0 SH 8X27 (SUTURE) ×4 IMPLANT
SYR 10ML LL (SYRINGE) ×3 IMPLANT
SYR BULB EAR ULCER 3OZ GRN STR (SYRINGE) ×3 IMPLANT
SYS SLING ADV FIT BLUE TRNSVAG (Sling) ×1 IMPLANT
TOWEL OR 17X26 10 PK STRL BLUE (TOWEL DISPOSABLE) ×3 IMPLANT
TRAY FOLEY W/BAG SLVR 14FR (SET/KITS/TRAYS/PACK) ×3 IMPLANT
TRAY FOLEY W/BAG SLVR 14FR LF (SET/KITS/TRAYS/PACK) ×3 IMPLANT
UNDERPAD 30X36 HEAVY ABSORB (UNDERPADS AND DIAPERS) ×3 IMPLANT

## 2021-05-11 NOTE — Discharge Instructions (Signed)

## 2021-05-11 NOTE — Interval H&P Note (Signed)
History and Physical Interval Note:  05/11/2021 7:15 AM  Kari Mcgee  has presented today for surgery, with the diagnosis of uterovaginal prolapse incomplete, anterior vaginal prolapse, posterior vaginal prolapse, stress urinary incontinence.  The various methods of treatment have been discussed with the patient and family. After consideration of risks, benefits and other options for treatment, the patient has consented to  Procedure(s): HYSTERECTOMY VAGINAL (N/A) VAGINAL VAULT SUSPENSION (N/A) ANTERIOR (CYSTOCELE) AND POSTERIOR REPAIR (RECTOCELE) (N/A) TRANSVAGINAL TAPE (TVT) PROCEDURE (N/A) as a surgical intervention.    Vitals:   05/11/21 0600  BP: (!) 144/78  Pulse: 74  Resp: 16  Temp: (!) 97.4 F (36.3 C)  SpO2: 96%    Gen: NAD CV: S1 S2 RRR Lungs: Clear to auscultation bilaterally Abd: soft, nontender   The patient's history has been reviewed, patient examined, no change in status, stable for surgery.  I have reviewed the patient's chart and labs.  Questions were answered to the patient's satisfaction.     Jaquita Folds

## 2021-05-11 NOTE — Anesthesia Procedure Notes (Deleted)
Spinal  Patient location during procedure: OR Reason for block: surgical anesthesia Staffing Performed: resident/CRNA  Resident/CRNA: Lollie Sails, CRNA Preanesthetic Checklist Completed: patient identified, IV checked, site marked, risks and benefits discussed, surgical consent, monitors and equipment checked, pre-op evaluation and timeout performed Spinal Block Patient position: sitting Prep: DuraPrep and site prepped and draped Patient monitoring: heart rate, continuous pulse ox, blood pressure and cardiac monitor Approach: midline Injection technique: single-shot Needle Needle type: Pencan  Needle gauge: 24 G Needle length: 10 cm Assessment Events: CSF return Additional Notes Expiration date on kit noted and within range.  Good CSF flow noted with no heme or c/o paresthesia.   Patient tolerated well.

## 2021-05-11 NOTE — Transfer of Care (Signed)
Immediate Anesthesia Transfer of Care Note  Patient: Kari Mcgee  Procedure(s) Performed: HYSTERECTOMY VAGINAL (Uterus) VAGINAL VAULT SUSPENSION (Vagina ) ANTERIOR (CYSTOCELE) AND POSTERIOR REPAIR (RECTOCELE) (Vagina ) TRANSVAGINAL TAPE (TVT) PROCEDURE (Vagina )  Patient Location: PACU  Anesthesia Type:General  Level of Consciousness: drowsy, patient cooperative and responds to stimulation  Airway & Oxygen Therapy: Patient Spontanous Breathing and Patient connected to face mask oxygen  Post-op Assessment: Report given to RN and Post -op Vital signs reviewed and stable  Post vital signs: Reviewed and stable  Last Vitals:  Vitals Value Taken Time  BP 142/67 05/11/21 1206  Temp    Pulse 87 05/11/21 1208  Resp 13 05/11/21 1208  SpO2 96 % 05/11/21 1208  Vitals shown include unvalidated device data.  Last Pain:  Vitals:   05/11/21 0600  TempSrc: Oral  PainSc: 0-No pain      Patients Stated Pain Goal: 5 (01/72/09 1068)  Complications: No notable events documented.

## 2021-05-11 NOTE — Op Note (Signed)
Operative Note  Preoperative Diagnosis: anterior vaginal prolapse, posterior vaginal prolapse, uterovaginal prolapse, incomplete, and stress urinary incontinence  Postoperative Diagnosis: same  Procedures performed:  Total vaginal hysterectomy, right salpingo-oophorectomy, uterosacral ligament suspension, anterior and posterior repair, midurethral sling, cystoscopy  Implants:  Implant Name Type Inv. Item Serial No. Manufacturer Lot No. LRB No. Used Action  SYS SLING ADV FIT BLUE TRNSVAG - [id number] Sling SYS SLING ADV FIT BLUE TRNSVAG  [employer] [id number] N/A 1 Implanted    Attending Surgeon: Sherlene Shams, MD  Anesthesia: General endotracheal  Findings: 1. Stage III prolapse on vaginal exam  2. On cystoscopy, normal bladder and urethra without injury or lesion.    3. Left side pelvic wall adhesions  4. Normal appearing uterus, cervix and right fallopian tube and ovary. Left fallopian tube and ovary could not be visualized.  Specimens:  ID Type Source Tests Collected by Time Destination  1 : cerviix, uterus, right fallopian tube, right ovary Tissue PATH Gyn benign resection SURGICAL PATHOLOGY Jaquita Folds, MD 05/11/2021 0908     Estimated blood loss: 150 mL  IV fluids: 1600 mL  Urine output: 062 mL  Complications: none  Procedure in Detail:  The patient was taken to the operating room where she was placed under anesthesia.  She was then placed in the dorsal lithotomy position with Allen stirrups and prepped and draped in the usual sterile fashion.  Care was taken to avoid hyperflexion or hyperextension of her lower extremities.    A self-retaining retractor was placed and a foley catheter was placed.  A short weighted speculum was placed in the vagina.   The cervix was grasped with two single-tooth tenacula.  The cervix was injected circumferentially with 1% lidocaine with epinephrine. A 10 blade was used to incise circumferentially around the cervix.   Anteriorly, the bladder was dissected off the pubocervical fascia using Metzenbaum scissors. A Deaver retractor was placed anteriorly to protect the bladder. A Heaney clamp was then used to clamp the uterosacral ligaments on each side. These were cut and suture ligated using 0-Vicryl in a Heaney fashion, and tagged with hemostats.The anterior peritoneal reflection was then identified, tented up and incised with Metzenbaum scissors to create an anterior colpotomy. Palpation confirmed peritoneal entry and no adhesions. The Deaver was placed anteriorly to protect the bladder. The posterior vagina was grasped with a Kocher clamp. The Mayo scissors were used to enter the posterior cul-de-sac. Palpation confirmed peritoneal entry and no adhesions. The posterior peritoneum was affixed to the vaginal cuff with 0-Vicryl (this suture was used throughout unless otherwise specified) at the midline. A right angle retractor was placed through the posterior colpotomy.  The cardinal ligaments were clamped, cut, and suture ligated in a similar fashion on each side. The uterine arteries were clamped, cut, and suture ligated. The cornua were clamped, cut, free-tied and suture ligated. The uterus and cervix were handed off the field.  Inspection of the pedicles revealed excellent hemostasis.  The left fallopian tube and ovary could not be visualized due to presence of adhesions. The right fallopian tube and ovary was grapsed with a Babcock clamp. It was then clamped with a long Kelly clamp, cut, and free-tied twice.  For the uterosacral ligament suspension (USLS), the bowel was packed away with a moistened lap. The posterior cuff edge was grasped with an Allis clamp.  The right and left uterosacral ligaments were identified visually and digitally. Two stitches of 0 PDS was placed through each uterosacral ligament towards its  insertion site at the sacrum. These were tagged with hemostats. The packing was removed.  A 70-degree  cystoscope was introduced, and 360-degree inspection revealed no trauma in the bladder, with bilateral ureteral efflux with tension on the uterosacral sutures.  The bladder was drained and the cystoscope was removed.  The Foley catheter was reinserted.    For the anterior repair, two Allis clamps were placed along the midline of the anterior vaginal wall.  1% lidocaine with epinephrine was injected into the vaginal mucosa.  A 15 blade scalpel was used to incise the vaginal mucosa in the midline. Allis clamps were placed along this incision and Metzenbaum scissors were used to sharply dissect the epithelium off of the vesicovaginal septum bilaterally to the level of the pubic rami. Anterior plication of the vesicovaginal septum was then performed using 2-0 Vicryl. The vaginal mucosal edges were trimmed and the incision reapproximated with 2-0 Vicryl in a running locked fashion. Hemostasis was noted. The uterosacral stitches were then attached to the posterior and anterior edges of the vaginal cuff on the ipsilateral sides, in a through and through fashion, using a free needle. Figure of eight sutures of 0-Vicryl were placed through the vaginal cuff and tied down. The uterosacral stitches were then tied down with good apical support noted. The Foley catheter was removed. A 70-degree cystoscope was introduced, and 360-degree inspection revealed no trauma in the bladder. No efflux was noted on the right side. An open ended catheter was attempted to be placed through the ureter but met resistance. This was removed and the right side PDS sutures were cut to allow right ureteral efflux.  The bladder was drained and the cystoscope was removed.  The Foley catheter was reinserted. The vaginal cuff on the right was closed with a 0-vicryl interrupted suture.    The mid urethral area was located on the anterior vaginal wall.  Two Allis clamps were placed at the level of the midurethra. 1% lidocaine with epinephrine was  injected into the vaginal mucosa. A vertical incision was made between the two clamps using a 15-blade scalpel.  Using sharp dissection, Metzenbaum scissors were used to make a periurethral tunnel from the vaginal incision towards the pubic rami bilaterally for the future sling tracts. The bladder was ensured to be empty. The trocar and attached sling were introduced into the right side of the periurethral vaginal incision, just inferior to the pubic symphysis on the right side. The trocar was guided through the endopelvic fascia and directly vertically.  While hugging the cephalad surface of the pubic bone, the trocar was guided out through the abdomen 2 fingerbreadths lateral to midline at the level of the pubic symphysis on the ipsilateral side. The trocar was placed on the left side in a similar fashion.  A 70-degree cystoscope was introduced, and 360-degree inspection revealed no trauma or trocars in the bladder, with brisk bilateral ureteral efflux.  The bladder was drained and the cystoscope was removed.  The Foley catheter was reinserted.  The sling was brought to lie beneath the mid-urethra.  A needle driver was placed behind the sling to ensure no tension.  The plastic sheath was removed from the sling and the distal ends of the sling were trimmed just below the level of the skin incisions. Hemostasis was noted.  Tension-free positioning of the sling was confirmed. Vaginal inspection revealed no vaginotomy or sling perforations of the mucosa. The vaginal mucosal edges were reapproximated using 2-0 Vicryl.   The suprapubic sling incisions  were closed with Dermabond.  Attention was then turned to the posterior vagina.  Two Allis clamps were in the midline of the posterior vaginal wall defect.  1% lidocaine with epinephrine was injected into the vaginal mucosa. A vertical incision was made between these clamps with a 15 blade scalpel.  The rectovaginal septum was then dissected off the vaginal mucosa  bilaterally. The rectovaginal septum was then plicated with vertical mattress sutures of 2-0 Vicryl.  After placement of the first plication stitch two fingers were inserted into the vaginal to confirm adequate caliber.  The last distal stitch incorporated the perineal body in a U stitch fashion.   After plication, the excess vaginal mucosa was trimmed and the vaginal mucosa was reapproximated using 2-0 Vicryl sutures.Hemostasis was noted. A rectal examination was normal and confirmed no sutures within the rectum.  The patient tolerated the procedure well.  She was awakened from anesthesia and transferred to the recovery room in stable condition. All counts were correct x 2.    Jaquita Folds, MD

## 2021-05-11 NOTE — Anesthesia Postprocedure Evaluation (Signed)
Anesthesia Post Note  Patient: AANYA HAYNES  Procedure(s) Performed: HYSTERECTOMY VAGINAL (Uterus) VAGINAL VAULT SUSPENSION (Vagina ) ANTERIOR (CYSTOCELE) AND POSTERIOR REPAIR (RECTOCELE) (Vagina ) TRANSVAGINAL TAPE (TVT) PROCEDURE (Vagina )     Anesthesia Post Evaluation No notable events documented.  Last Vitals:  Vitals:   05/11/21 1345 05/11/21 1445  BP: 122/72 130/74  Pulse: 77 77  Resp: 16 16  Temp: 36.7 C 36.7 C  SpO2: 97% 98%    Last Pain:  Vitals:   05/11/21 1500  TempSrc:   PainSc: 7                  Santhiago Collingsworth

## 2021-05-11 NOTE — Anesthesia Procedure Notes (Signed)
Procedure Name: Intubation Date/Time: 05/11/2021 7:40 AM Performed by: Lollie Sails, CRNA Pre-anesthesia Checklist: Patient identified, Emergency Drugs available, Suction available, Patient being monitored and Timeout performed Patient Re-evaluated:Patient Re-evaluated prior to induction Oxygen Delivery Method: Circle system utilized Preoxygenation: Pre-oxygenation with 100% oxygen Induction Type: IV induction Ventilation: Mask ventilation without difficulty Laryngoscope Size: Miller and 3 Grade View: Grade I Tube type: Oral Tube size: 7.0 mm Number of attempts: 1 Airway Equipment and Method: Stylet Placement Confirmation: ETT inserted through vocal cords under direct vision, positive ETCO2 and breath sounds checked- equal and bilateral Secured at: 21 cm Tube secured with: Tape Dental Injury: Teeth and Oropharynx as per pre-operative assessment  Difficulty Due To: Difficult Airway- due to anterior larynx Comments: Able to visualize laryngeal opening with external cricoid pressure.

## 2021-05-12 ENCOUNTER — Encounter: Payer: Self-pay | Admitting: *Deleted

## 2021-05-12 ENCOUNTER — Encounter (HOSPITAL_BASED_OUTPATIENT_CLINIC_OR_DEPARTMENT_OTHER): Payer: Self-pay | Admitting: Obstetrics and Gynecology

## 2021-05-12 LAB — SURGICAL PATHOLOGY

## 2021-05-12 NOTE — Progress Notes (Signed)
1 Day Post-Op Procedure(s) (LRB): HYSTERECTOMY VAGINAL (N/A) VAGINAL VAULT SUSPENSION (N/A) ANTERIOR (CYSTOCELE) AND POSTERIOR REPAIR (RECTOCELE) (N/A) TRANSVAGINAL TAPE (TVT) PROCEDURE (N/A)  Subjective: Had nausea and vomiting yesterday. Now has scopolamine patch and is a lot better. Has not yet eaten breakfast. Has ambulated. Passed voiding trial yesterday and has been urinating well.   Objective: Vitals:   05/12/21 0145 05/12/21 0615  BP: 99/60 (!) 111/59  Pulse: 80 74  Resp: 18 18  Temp: 98.1 F (36.7 C) 98.1 F (36.7 C)  SpO2: 94% 94%     General: alert, cooperative, and no distress GI: soft, non-tender; bowel sounds normal; no masses,  no organomegaly Extremities: extremities normal, atraumatic, no cyanosis or edema  Assessment: s/p Procedure(s): HYSTERECTOMY VAGINAL (N/A) VAGINAL VAULT SUSPENSION (N/A) ANTERIOR (CYSTOCELE) AND POSTERIOR REPAIR (RECTOCELE) (N/A) TRANSVAGINAL TAPE (TVT) PROCEDURE (N/A): stable  Plan: Advance diet Encourage ambulation Discharge home  LOS: 0 days    Jaquita Folds 05/12/2021, 8:08 AM

## 2021-05-15 ENCOUNTER — Telehealth: Payer: Self-pay | Admitting: Obstetrics and Gynecology

## 2021-06-22 ENCOUNTER — Ambulatory Visit (INDEPENDENT_AMBULATORY_CARE_PROVIDER_SITE_OTHER): Payer: Medicare HMO | Admitting: Obstetrics and Gynecology

## 2021-06-22 ENCOUNTER — Encounter: Payer: Self-pay | Admitting: Obstetrics and Gynecology

## 2021-06-22 ENCOUNTER — Other Ambulatory Visit: Payer: Self-pay

## 2021-06-22 VITALS — BP 143/76 | HR 73 | Wt 138.0 lb

## 2021-06-22 DIAGNOSIS — Z9889 Other specified postprocedural states: Secondary | ICD-10-CM

## 2021-06-22 NOTE — Progress Notes (Signed)
Kari Mcgee  Date of Visit: 06/22/2021  History of Present Illness: [name] is a 75 y.o. female scheduled today for a post-operative visit.   Surgery: s/p Total vaginal hysterectomy, right salpingo-oophorectomy, uterosacral ligament suspension, anterior and posterior repair, midurethral sling, cystoscopy on 05/11/21  She passed her postoperative void trial.   Postoperative course has been uncomplicated.   Today she reports she is doing well. Has no complaints.   UTI in the last 6 weeks? No  Pain? No  She has returned to her normal activity (except for postop restrictions) Vaginal bulge? No  Stress incontinence: No  Urgency/frequency: Yes , sometimes Urge incontinence: No  Voiding dysfunction: No  Bowel issues: No   Subjective Success: Do you usually have a bulge or something falling out that you can see or feel in the vaginal area? No  Retreatment Success: Any retreatment with surgery or pessary for any compartment? 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   Allergies: Patient has No Known Allergies.   Physical Exam: BP (!) 143/76   Pulse 73   Wt 138 lb (62.6 kg)   BMI 25.24 kg/m   Abdomen: soft, non-tender, without masses or organomegaly Suprapubic incisions well healed.  Pelvic  Examination: Vagina: Incisions healing well. Sutures are present at incision line and at the cuff and there is not granulation tissue. No tenderness along the anterior or posterior vagina. No apical tenderness. No pelvic masses. No visible or palpable mesh.  POP-Q: POP-Q  -3                                            Aa   -3                                           Ba  -7                                              C   2.5                                            Gh  2  Pb  7                                            tvl   -3                                            Ap  -3                                            Bp                                                 D    ---------------------------------------------------------  Assessment and Plan:  1. Post-operative state     - Pathology results were reviewed with the patient today and she verbalized understanding that the results were benign.  - The patient was given a copy of her operative note and pathology report for her records. - Can gradually resume regular activity including exercise and intercourse,  if desired.  - Discussed avoidance of very heavy lifting and straining long term to reduce the risk of recurrence.   All questions answered.   Jaquita Folds, MD

## 2021-08-05 DIAGNOSIS — E119 Type 2 diabetes mellitus without complications: Secondary | ICD-10-CM | POA: Diagnosis not present

## 2021-08-05 DIAGNOSIS — M2041 Other hammer toe(s) (acquired), right foot: Secondary | ICD-10-CM | POA: Diagnosis not present

## 2021-08-05 DIAGNOSIS — M898X9 Other specified disorders of bone, unspecified site: Secondary | ICD-10-CM | POA: Diagnosis not present

## 2021-08-05 DIAGNOSIS — B351 Tinea unguium: Secondary | ICD-10-CM | POA: Diagnosis not present

## 2021-09-14 NOTE — Telephone Encounter (Signed)
completed

## 2021-09-17 DIAGNOSIS — J301 Allergic rhinitis due to pollen: Secondary | ICD-10-CM | POA: Diagnosis not present

## 2021-09-17 DIAGNOSIS — H9041 Sensorineural hearing loss, unilateral, right ear, with unrestricted hearing on the contralateral side: Secondary | ICD-10-CM | POA: Diagnosis not present

## 2021-09-17 DIAGNOSIS — H6981 Other specified disorders of Eustachian tube, right ear: Secondary | ICD-10-CM | POA: Diagnosis not present

## 2021-09-17 DIAGNOSIS — H7201 Central perforation of tympanic membrane, right ear: Secondary | ICD-10-CM | POA: Diagnosis not present

## 2021-09-22 DIAGNOSIS — E119 Type 2 diabetes mellitus without complications: Secondary | ICD-10-CM | POA: Diagnosis not present

## 2021-09-28 DIAGNOSIS — Z1389 Encounter for screening for other disorder: Secondary | ICD-10-CM | POA: Diagnosis not present

## 2021-09-28 DIAGNOSIS — Z0000 Encounter for general adult medical examination without abnormal findings: Secondary | ICD-10-CM | POA: Diagnosis not present

## 2021-09-28 DIAGNOSIS — K219 Gastro-esophageal reflux disease without esophagitis: Secondary | ICD-10-CM | POA: Diagnosis not present

## 2021-09-28 DIAGNOSIS — E119 Type 2 diabetes mellitus without complications: Secondary | ICD-10-CM | POA: Diagnosis not present

## 2021-09-28 DIAGNOSIS — E7800 Pure hypercholesterolemia, unspecified: Secondary | ICD-10-CM | POA: Diagnosis not present

## 2021-11-02 IMAGING — MG DIGITAL DIAGNOSTIC BILAT W/ TOMO W/ CAD
7 series · 9 of 23 positions shown · non-contrast
Comparison: Previous exam(s).

CLINICAL DATA: One year follow-up for probably benign LEFT breast
mass.

EXAM:
DIGITAL DIAGNOSTIC BILATERAL MAMMOGRAM WITH TOMO AND CAD

[L MLO synth-2D]
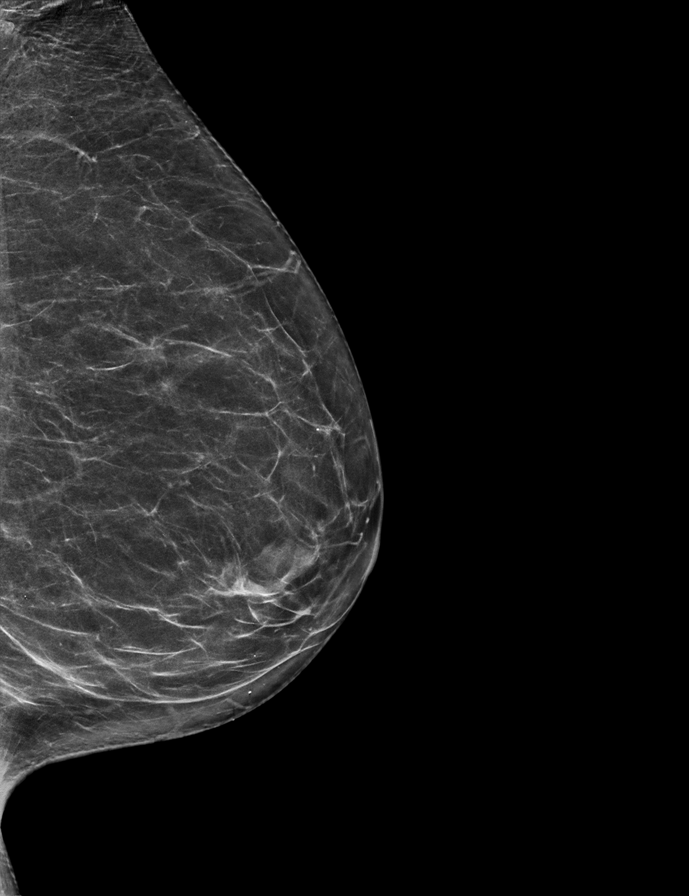

[R CC synth-2D]
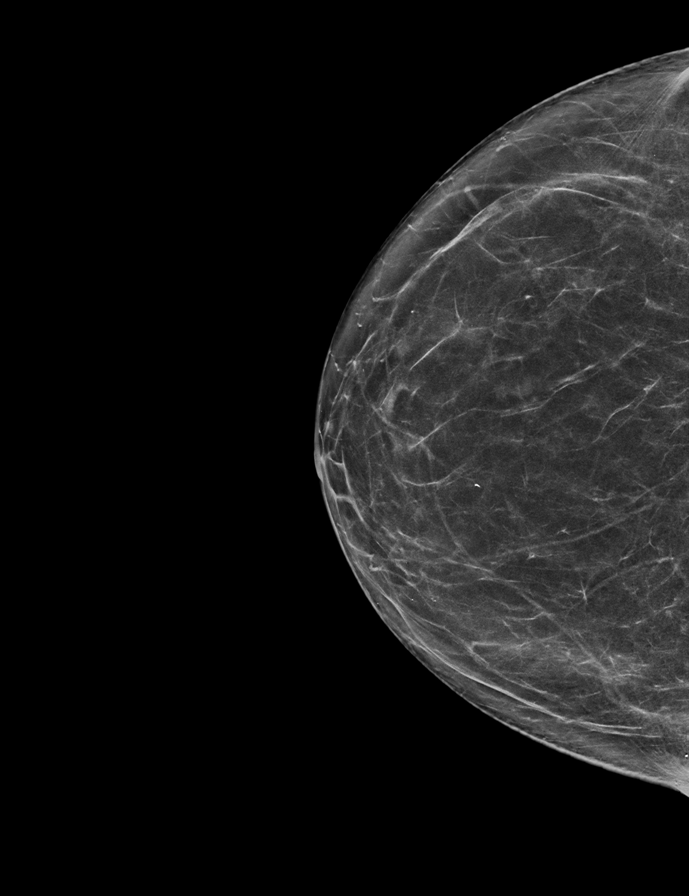

[L CC synth-2D]
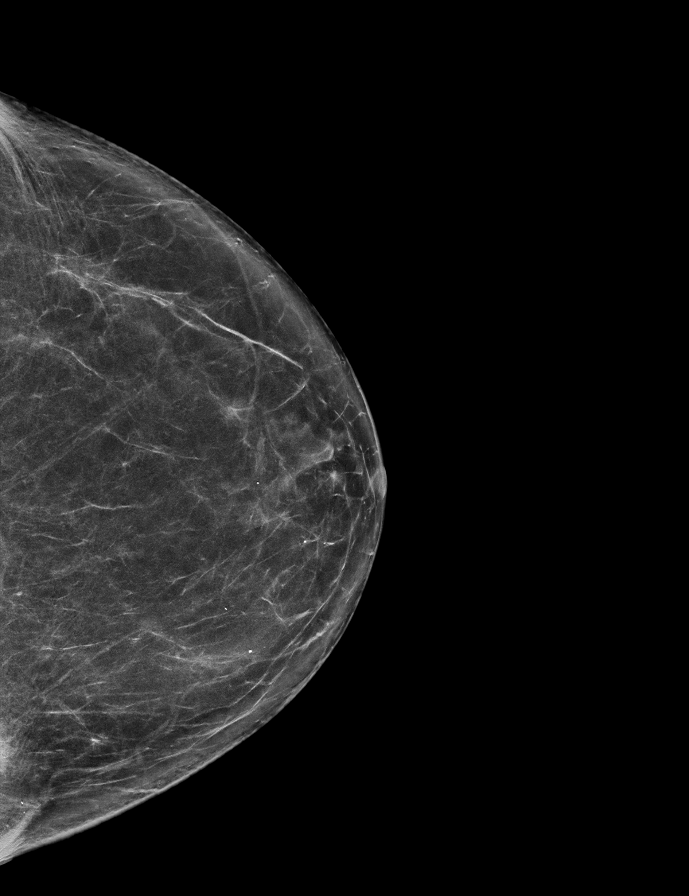

[R MLO tomo · 3 of 66 frames shown]
[frame 22/66]
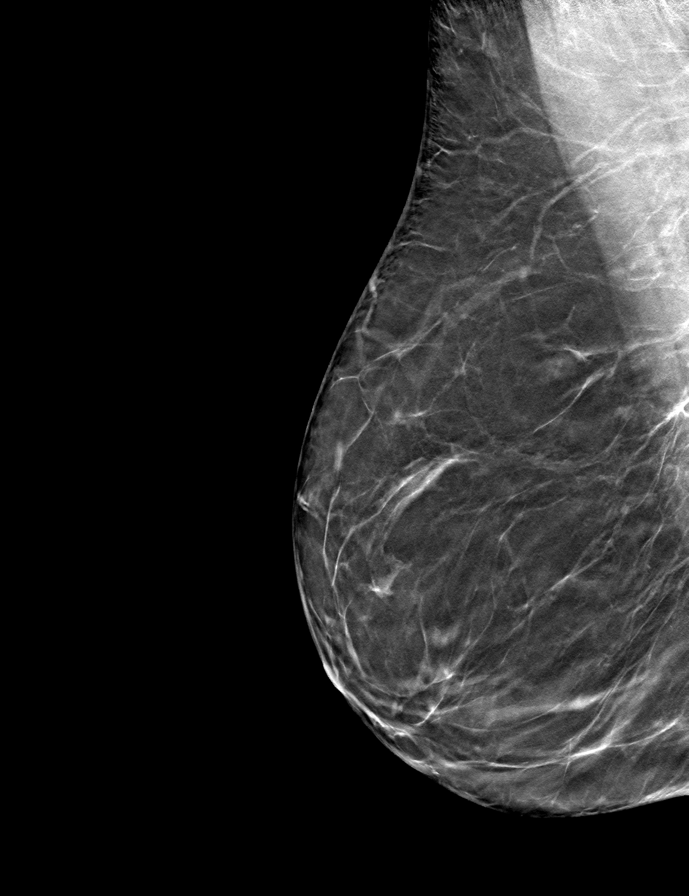
[frame 33/66]
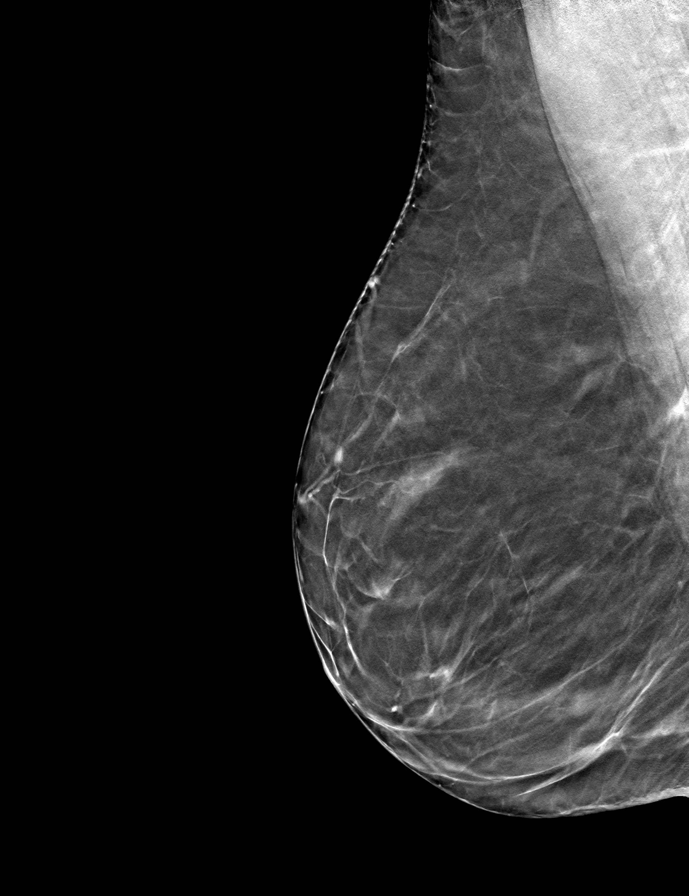
[frame 45/66]
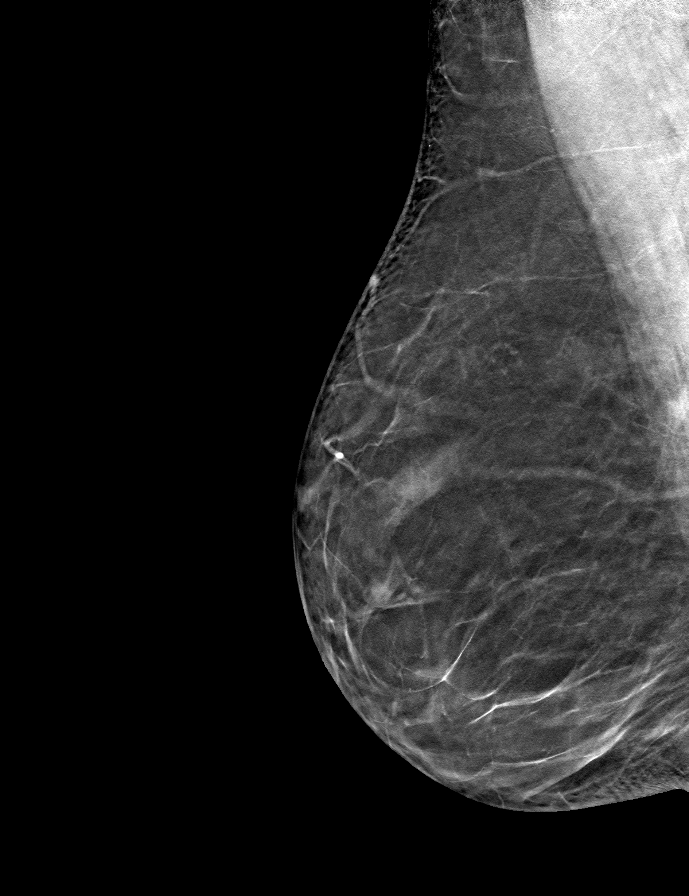

[R CC tomo · tomo slice 34/67.0]
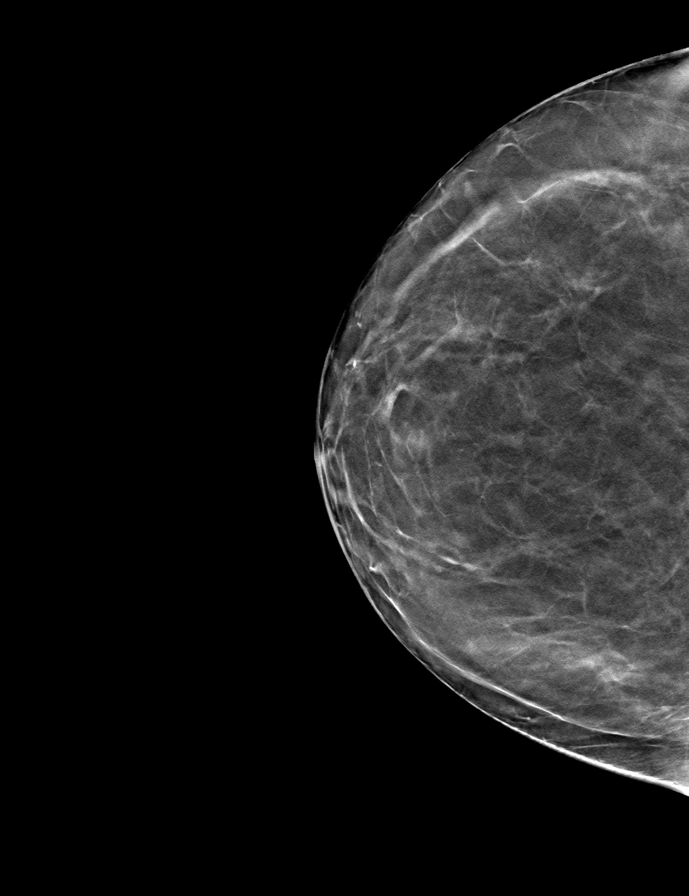

[L CC tomo · tomo slice 35/69.0]
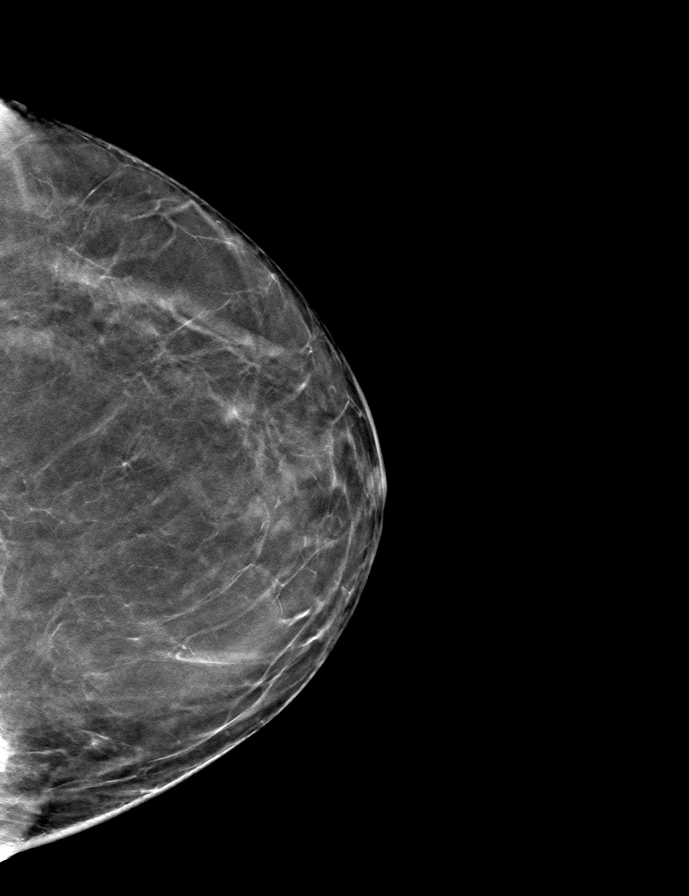

[L MLO tomo · tomo slice 35/68.0]
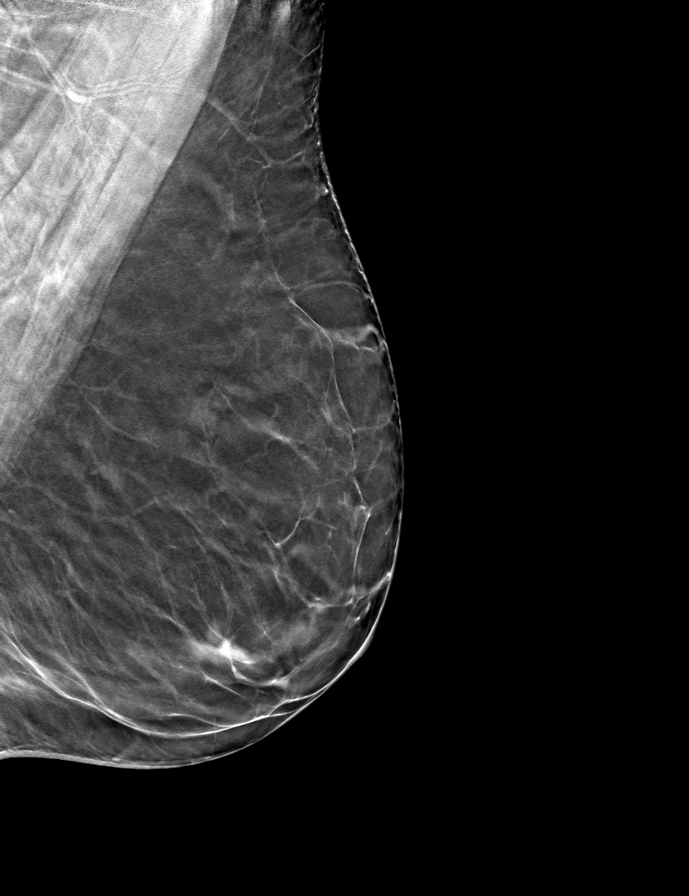

[9 of 23 positions shown; findings below may reference images not displayed]

ACR Breast Density Category b: There are scattered areas of
fibroglandular density.
FINDINGS: No suspicious mass, distortion, or microcalcifications are
identified to suggest presence of malignancy. There has been
interval resolution of small circumscribed oval mass in the
UPPER-OUTER QUADRANT of the LEFT breast.

Mammographic images were processed with CAD.
IMPRESSION: No mammographic evidence for malignancy.

RECOMMENDATION:
Insert screen

I have discussed the findings and recommendations with the patient.
If applicable, a reminder letter will be sent to the patient
regarding the next appointment.

BI-RADS CATEGORY  1: Negative.

## 2022-01-17 DIAGNOSIS — M542 Cervicalgia: Secondary | ICD-10-CM | POA: Diagnosis not present

## 2022-01-17 DIAGNOSIS — M79601 Pain in right arm: Secondary | ICD-10-CM | POA: Diagnosis not present

## 2022-01-17 DIAGNOSIS — M79602 Pain in left arm: Secondary | ICD-10-CM | POA: Diagnosis not present

## 2022-02-02 ENCOUNTER — Other Ambulatory Visit: Payer: Self-pay | Admitting: Internal Medicine

## 2022-02-02 DIAGNOSIS — Z1231 Encounter for screening mammogram for malignant neoplasm of breast: Secondary | ICD-10-CM

## 2022-02-11 DIAGNOSIS — J329 Chronic sinusitis, unspecified: Secondary | ICD-10-CM | POA: Diagnosis not present

## 2022-02-11 DIAGNOSIS — U071 COVID-19: Secondary | ICD-10-CM | POA: Diagnosis not present

## 2022-02-19 DIAGNOSIS — Z23 Encounter for immunization: Secondary | ICD-10-CM | POA: Diagnosis not present

## 2022-02-19 DIAGNOSIS — R197 Diarrhea, unspecified: Secondary | ICD-10-CM | POA: Diagnosis not present

## 2022-02-19 DIAGNOSIS — T63481A Toxic effect of venom of other arthropod, accidental (unintentional), initial encounter: Secondary | ICD-10-CM | POA: Diagnosis not present

## 2022-02-19 DIAGNOSIS — W57XXXA Bitten or stung by nonvenomous insect and other nonvenomous arthropods, initial encounter: Secondary | ICD-10-CM | POA: Diagnosis not present

## 2022-02-25 ENCOUNTER — Ambulatory Visit
Admission: RE | Admit: 2022-02-25 | Discharge: 2022-02-25 | Disposition: A | Discharge status: Home / Self Care | Payer: Medicare HMO | Source: Ambulatory Visit | Attending: Internal Medicine | Admitting: Internal Medicine

## 2022-02-25 DIAGNOSIS — Z1231 Encounter for screening mammogram for malignant neoplasm of breast: Secondary | ICD-10-CM | POA: Insufficient documentation

## 2022-03-24 DIAGNOSIS — E119 Type 2 diabetes mellitus without complications: Secondary | ICD-10-CM | POA: Diagnosis not present

## 2022-03-31 DIAGNOSIS — K219 Gastro-esophageal reflux disease without esophagitis: Secondary | ICD-10-CM | POA: Diagnosis not present

## 2022-03-31 DIAGNOSIS — Z0001 Encounter for general adult medical examination with abnormal findings: Secondary | ICD-10-CM | POA: Diagnosis not present

## 2022-03-31 DIAGNOSIS — E119 Type 2 diabetes mellitus without complications: Secondary | ICD-10-CM | POA: Diagnosis not present

## 2022-03-31 DIAGNOSIS — E7800 Pure hypercholesterolemia, unspecified: Secondary | ICD-10-CM | POA: Diagnosis not present

## 2022-03-31 DIAGNOSIS — M25512 Pain in left shoulder: Secondary | ICD-10-CM | POA: Diagnosis not present

## 2022-04-16 DIAGNOSIS — M75102 Unspecified rotator cuff tear or rupture of left shoulder, not specified as traumatic: Secondary | ICD-10-CM | POA: Diagnosis not present

## 2022-09-17 DIAGNOSIS — H7201 Central perforation of tympanic membrane, right ear: Secondary | ICD-10-CM | POA: Diagnosis not present

## 2022-09-17 DIAGNOSIS — H6981 Other specified disorders of Eustachian tube, right ear: Secondary | ICD-10-CM | POA: Diagnosis not present

## 2022-09-24 DIAGNOSIS — E119 Type 2 diabetes mellitus without complications: Secondary | ICD-10-CM | POA: Diagnosis not present

## 2022-09-24 DIAGNOSIS — B9689 Other specified bacterial agents as the cause of diseases classified elsewhere: Secondary | ICD-10-CM | POA: Diagnosis not present

## 2022-09-24 DIAGNOSIS — J019 Acute sinusitis, unspecified: Secondary | ICD-10-CM | POA: Diagnosis not present

## 2022-09-24 DIAGNOSIS — H109 Unspecified conjunctivitis: Secondary | ICD-10-CM | POA: Diagnosis not present

## 2022-10-01 DIAGNOSIS — E119 Type 2 diabetes mellitus without complications: Secondary | ICD-10-CM | POA: Diagnosis not present

## 2022-10-01 DIAGNOSIS — K219 Gastro-esophageal reflux disease without esophagitis: Secondary | ICD-10-CM | POA: Diagnosis not present

## 2022-10-01 DIAGNOSIS — Z1331 Encounter for screening for depression: Secondary | ICD-10-CM | POA: Diagnosis not present

## 2022-10-01 DIAGNOSIS — E7800 Pure hypercholesterolemia, unspecified: Secondary | ICD-10-CM | POA: Diagnosis not present

## 2022-10-01 DIAGNOSIS — Z0000 Encounter for general adult medical examination without abnormal findings: Secondary | ICD-10-CM | POA: Diagnosis not present

## 2022-10-01 DIAGNOSIS — L84 Corns and callosities: Secondary | ICD-10-CM | POA: Diagnosis not present

## 2022-10-13 DIAGNOSIS — L600 Ingrowing nail: Secondary | ICD-10-CM | POA: Diagnosis not present

## 2022-10-13 DIAGNOSIS — B351 Tinea unguium: Secondary | ICD-10-CM | POA: Diagnosis not present

## 2022-10-13 DIAGNOSIS — E119 Type 2 diabetes mellitus without complications: Secondary | ICD-10-CM | POA: Diagnosis not present

## 2022-10-13 DIAGNOSIS — M898X9 Other specified disorders of bone, unspecified site: Secondary | ICD-10-CM | POA: Diagnosis not present

## 2022-10-13 DIAGNOSIS — M2041 Other hammer toe(s) (acquired), right foot: Secondary | ICD-10-CM | POA: Diagnosis not present

## 2022-10-22 DIAGNOSIS — J209 Acute bronchitis, unspecified: Secondary | ICD-10-CM | POA: Diagnosis not present

## 2022-10-22 DIAGNOSIS — S39012A Strain of muscle, fascia and tendon of lower back, initial encounter: Secondary | ICD-10-CM | POA: Diagnosis not present

## 2022-10-22 DIAGNOSIS — B9689 Other specified bacterial agents as the cause of diseases classified elsewhere: Secondary | ICD-10-CM | POA: Diagnosis not present

## 2022-10-22 DIAGNOSIS — J019 Acute sinusitis, unspecified: Secondary | ICD-10-CM | POA: Diagnosis not present

## 2022-11-14 DIAGNOSIS — B9689 Other specified bacterial agents as the cause of diseases classified elsewhere: Secondary | ICD-10-CM | POA: Diagnosis not present

## 2022-11-14 DIAGNOSIS — J209 Acute bronchitis, unspecified: Secondary | ICD-10-CM | POA: Diagnosis not present

## 2022-11-14 DIAGNOSIS — J029 Acute pharyngitis, unspecified: Secondary | ICD-10-CM | POA: Diagnosis not present

## 2022-11-14 DIAGNOSIS — J019 Acute sinusitis, unspecified: Secondary | ICD-10-CM | POA: Diagnosis not present

## 2022-12-02 DIAGNOSIS — J069 Acute upper respiratory infection, unspecified: Secondary | ICD-10-CM | POA: Diagnosis not present

## 2022-12-02 DIAGNOSIS — Z8709 Personal history of other diseases of the respiratory system: Secondary | ICD-10-CM | POA: Diagnosis not present

## 2022-12-23 DIAGNOSIS — J4520 Mild intermittent asthma, uncomplicated: Secondary | ICD-10-CM | POA: Diagnosis not present

## 2023-01-07 DIAGNOSIS — J4531 Mild persistent asthma with (acute) exacerbation: Secondary | ICD-10-CM | POA: Diagnosis not present

## 2023-01-07 DIAGNOSIS — R0602 Shortness of breath: Secondary | ICD-10-CM | POA: Diagnosis not present

## 2023-01-07 DIAGNOSIS — R059 Cough, unspecified: Secondary | ICD-10-CM | POA: Diagnosis not present

## 2023-01-07 DIAGNOSIS — R062 Wheezing: Secondary | ICD-10-CM | POA: Diagnosis not present

## 2023-01-11 DIAGNOSIS — W57XXXA Bitten or stung by nonvenomous insect and other nonvenomous arthropods, initial encounter: Secondary | ICD-10-CM | POA: Diagnosis not present

## 2023-01-11 DIAGNOSIS — S40861A Insect bite (nonvenomous) of right upper arm, initial encounter: Secondary | ICD-10-CM | POA: Diagnosis not present

## 2023-01-17 DIAGNOSIS — R059 Cough, unspecified: Secondary | ICD-10-CM | POA: Diagnosis not present

## 2023-01-17 DIAGNOSIS — R0602 Shortness of breath: Secondary | ICD-10-CM | POA: Diagnosis not present

## 2023-01-18 ENCOUNTER — Other Ambulatory Visit: Payer: Self-pay | Admitting: Internal Medicine

## 2023-01-18 DIAGNOSIS — Z1231 Encounter for screening mammogram for malignant neoplasm of breast: Secondary | ICD-10-CM

## 2023-01-26 DIAGNOSIS — J4520 Mild intermittent asthma, uncomplicated: Secondary | ICD-10-CM | POA: Diagnosis not present

## 2023-01-26 DIAGNOSIS — K219 Gastro-esophageal reflux disease without esophagitis: Secondary | ICD-10-CM | POA: Diagnosis not present

## 2023-01-26 DIAGNOSIS — R053 Chronic cough: Secondary | ICD-10-CM | POA: Diagnosis not present

## 2023-01-26 DIAGNOSIS — R1314 Dysphagia, pharyngoesophageal phase: Secondary | ICD-10-CM | POA: Diagnosis not present

## 2023-01-28 ENCOUNTER — Other Ambulatory Visit: Payer: Self-pay | Admitting: Specialist

## 2023-01-28 DIAGNOSIS — R1314 Dysphagia, pharyngoesophageal phase: Secondary | ICD-10-CM

## 2023-02-04 DIAGNOSIS — R42 Dizziness and giddiness: Secondary | ICD-10-CM | POA: Diagnosis not present

## 2023-03-01 ENCOUNTER — Ambulatory Visit
Admission: RE | Admit: 2023-03-01 | Discharge: 2023-03-01 | Disposition: A | Discharge status: Home / Self Care | Payer: Medicare HMO | Source: Ambulatory Visit | Attending: Internal Medicine | Admitting: Internal Medicine

## 2023-03-01 DIAGNOSIS — Z1231 Encounter for screening mammogram for malignant neoplasm of breast: Secondary | ICD-10-CM | POA: Diagnosis not present

## 2023-03-03 ENCOUNTER — Ambulatory Visit
Admission: RE | Admit: 2023-03-03 | Discharge: 2023-03-03 | Disposition: A | Discharge status: Home / Self Care | Payer: Medicare HMO | Source: Ambulatory Visit | Attending: Specialist | Admitting: Specialist

## 2023-03-03 DIAGNOSIS — R1312 Dysphagia, oropharyngeal phase: Secondary | ICD-10-CM | POA: Diagnosis not present

## 2023-03-03 DIAGNOSIS — R1314 Dysphagia, pharyngoesophageal phase: Secondary | ICD-10-CM | POA: Diagnosis not present

## 2023-03-03 NOTE — Evaluation (Signed)
Modified Barium Swallow Study  Patient Details  Name: Kari Mcgee MRN: [id number] Date of Birth: 09-13-1945  Today's Date: 03/03/2023  Modified Barium Swallow completed.  Full report located under Chart Review in the Imaging Section.  History of Present Illness Pt is a 77 y.o. female seen for MBSS. Pt recently seen by pulmonologist office in June 2024 with concerns for mild intermittent reactive airway disease, chronic cough, GERD, dysphagia, and  PMHx: Hyperlipidemia, GERD, controlled type 2 diabetes mellitus without complication, primary osteoarthritis of both hands. No recent chest imaging in chart. No reported previous speech therapy intervention/instrumental assessment.   Clinical Impression Pt presents with minimal oropharyngeal dysphagia c/b mildly reduced base of tongue retraction resulting in vallecular residue for more viscous trials (solids). Pt able to clear residue with independent subsequent swallow and extended time. No aspiration/penetration noted across trials. Minimally slowed clearance noted secondary to prominence a the level of C6, though no esophageal retention observed. Further esophageal assessment potentially warranted. Oral phase grossly intact with trace residue independently cleared consistently from oral cavity. Education shared for results of study and use of subsequent swallow as compensation for more challenging solids. Pt reported understanding. Recommend continued regular solids and thin liquids diet with aspiration/esophageal precautions (slow rate, small bites, elevated positioning during and after intake, alert for intake). No further SLP services indicated.    Factors that may increase risk of adverse event in presence of aspiration Rubye Oaks & Clearance Coots 2021): Respiratory or GI disease (hx of GERD)  Swallow Evaluation Recommendations Recommendations: PO diet PO Diet Recommendation: Regular;Thin liquids (Level 0) Liquid Administration via:  Straw;Cup Medication Administration: Whole meds with liquid Supervision: Patient able to self-feed Swallowing strategies  : Minimize environmental distractions;Small bites/sips;Slow rate (secondary swallow intermittently) Postural changes: Position pt fully upright for meals;Stay upright 30-60 min after meals Oral care recommendations: Oral care BID (2x/day) Recommended consults: Consider esophageal assessment (if warranted by medical team)   [place] Sigmon Clapp  MS [facility] SLP    [place] J Clapp 03/03/2023,1:37 PM

## 2023-03-30 DIAGNOSIS — E119 Type 2 diabetes mellitus without complications: Secondary | ICD-10-CM | POA: Diagnosis not present

## 2023-04-06 DIAGNOSIS — M19041 Primary osteoarthritis, right hand: Secondary | ICD-10-CM | POA: Diagnosis not present

## 2023-04-06 DIAGNOSIS — M19042 Primary osteoarthritis, left hand: Secondary | ICD-10-CM | POA: Diagnosis not present

## 2023-04-06 DIAGNOSIS — E119 Type 2 diabetes mellitus without complications: Secondary | ICD-10-CM | POA: Diagnosis not present

## 2023-04-06 DIAGNOSIS — Z0001 Encounter for general adult medical examination with abnormal findings: Secondary | ICD-10-CM | POA: Diagnosis not present

## 2023-04-06 DIAGNOSIS — K219 Gastro-esophageal reflux disease without esophagitis: Secondary | ICD-10-CM | POA: Diagnosis not present

## 2023-04-06 DIAGNOSIS — E7800 Pure hypercholesterolemia, unspecified: Secondary | ICD-10-CM | POA: Diagnosis not present

## 2023-04-14 DIAGNOSIS — H35372 Puckering of macula, left eye: Secondary | ICD-10-CM | POA: Diagnosis not present

## 2023-04-14 DIAGNOSIS — R7303 Prediabetes: Secondary | ICD-10-CM | POA: Diagnosis not present

## 2023-04-14 DIAGNOSIS — H2513 Age-related nuclear cataract, bilateral: Secondary | ICD-10-CM | POA: Diagnosis not present

## 2023-04-22 DIAGNOSIS — G5792 Unspecified mononeuropathy of left lower limb: Secondary | ICD-10-CM | POA: Diagnosis not present

## 2023-04-22 DIAGNOSIS — M898X9 Other specified disorders of bone, unspecified site: Secondary | ICD-10-CM | POA: Diagnosis not present

## 2023-04-22 DIAGNOSIS — E119 Type 2 diabetes mellitus without complications: Secondary | ICD-10-CM | POA: Diagnosis not present

## 2023-05-13 DIAGNOSIS — H25813 Combined forms of age-related cataract, bilateral: Secondary | ICD-10-CM | POA: Diagnosis not present

## 2023-05-13 DIAGNOSIS — H35373 Puckering of macula, bilateral: Secondary | ICD-10-CM | POA: Diagnosis not present

## 2023-06-14 DIAGNOSIS — Z01818 Encounter for other preprocedural examination: Secondary | ICD-10-CM | POA: Diagnosis not present

## 2023-06-14 DIAGNOSIS — H25812 Combined forms of age-related cataract, left eye: Secondary | ICD-10-CM | POA: Diagnosis not present

## 2023-06-21 DIAGNOSIS — H25812 Combined forms of age-related cataract, left eye: Secondary | ICD-10-CM | POA: Diagnosis not present

## 2023-06-21 DIAGNOSIS — Z79899 Other long term (current) drug therapy: Secondary | ICD-10-CM | POA: Diagnosis not present

## 2023-06-21 DIAGNOSIS — H52223 Regular astigmatism, bilateral: Secondary | ICD-10-CM | POA: Diagnosis not present

## 2023-06-21 DIAGNOSIS — H259 Unspecified age-related cataract: Secondary | ICD-10-CM | POA: Diagnosis not present

## 2023-06-21 DIAGNOSIS — H35373 Puckering of macula, bilateral: Secondary | ICD-10-CM | POA: Diagnosis not present

## 2023-06-21 DIAGNOSIS — Z7951 Long term (current) use of inhaled steroids: Secondary | ICD-10-CM | POA: Diagnosis not present

## 2023-06-21 DIAGNOSIS — E785 Hyperlipidemia, unspecified: Secondary | ICD-10-CM | POA: Diagnosis not present

## 2023-06-21 DIAGNOSIS — J45909 Unspecified asthma, uncomplicated: Secondary | ICD-10-CM | POA: Diagnosis not present

## 2023-06-30 DIAGNOSIS — J4521 Mild intermittent asthma with (acute) exacerbation: Secondary | ICD-10-CM | POA: Diagnosis not present

## 2023-06-30 DIAGNOSIS — R053 Chronic cough: Secondary | ICD-10-CM | POA: Diagnosis not present

## 2023-07-15 DIAGNOSIS — J45901 Unspecified asthma with (acute) exacerbation: Secondary | ICD-10-CM | POA: Diagnosis not present

## 2023-07-26 DIAGNOSIS — J45909 Unspecified asthma, uncomplicated: Secondary | ICD-10-CM | POA: Diagnosis not present

## 2023-08-05 DIAGNOSIS — H109 Unspecified conjunctivitis: Secondary | ICD-10-CM | POA: Diagnosis not present

## 2023-08-05 DIAGNOSIS — J45901 Unspecified asthma with (acute) exacerbation: Secondary | ICD-10-CM | POA: Diagnosis not present

## 2023-08-23 DIAGNOSIS — H25811 Combined forms of age-related cataract, right eye: Secondary | ICD-10-CM | POA: Diagnosis not present

## 2023-08-23 DIAGNOSIS — H259 Unspecified age-related cataract: Secondary | ICD-10-CM | POA: Diagnosis not present

## 2023-08-23 DIAGNOSIS — H52223 Regular astigmatism, bilateral: Secondary | ICD-10-CM | POA: Diagnosis not present

## 2023-08-23 DIAGNOSIS — H35373 Puckering of macula, bilateral: Secondary | ICD-10-CM | POA: Diagnosis not present

## 2023-09-20 DIAGNOSIS — J45901 Unspecified asthma with (acute) exacerbation: Secondary | ICD-10-CM | POA: Diagnosis not present

## 2023-10-04 DIAGNOSIS — E119 Type 2 diabetes mellitus without complications: Secondary | ICD-10-CM | POA: Diagnosis not present

## 2023-10-11 DIAGNOSIS — Z0000 Encounter for general adult medical examination without abnormal findings: Secondary | ICD-10-CM | POA: Diagnosis not present

## 2023-10-11 DIAGNOSIS — E119 Type 2 diabetes mellitus without complications: Secondary | ICD-10-CM | POA: Diagnosis not present

## 2023-10-11 DIAGNOSIS — K219 Gastro-esophageal reflux disease without esophagitis: Secondary | ICD-10-CM | POA: Diagnosis not present

## 2023-10-11 DIAGNOSIS — Z1331 Encounter for screening for depression: Secondary | ICD-10-CM | POA: Diagnosis not present

## 2023-10-11 DIAGNOSIS — E7800 Pure hypercholesterolemia, unspecified: Secondary | ICD-10-CM | POA: Diagnosis not present

## 2023-10-14 DIAGNOSIS — K219 Gastro-esophageal reflux disease without esophagitis: Secondary | ICD-10-CM | POA: Diagnosis not present

## 2023-10-14 DIAGNOSIS — R053 Chronic cough: Secondary | ICD-10-CM | POA: Diagnosis not present

## 2023-10-14 DIAGNOSIS — J8283 Eosinophilic asthma: Secondary | ICD-10-CM | POA: Diagnosis not present

## 2023-10-19 DIAGNOSIS — H04121 Dry eye syndrome of right lacrimal gland: Secondary | ICD-10-CM | POA: Diagnosis not present

## 2024-01-26 DIAGNOSIS — J8283 Eosinophilic asthma: Secondary | ICD-10-CM | POA: Diagnosis not present

## 2024-01-26 DIAGNOSIS — J4541 Moderate persistent asthma with (acute) exacerbation: Secondary | ICD-10-CM | POA: Diagnosis not present

## 2024-01-26 DIAGNOSIS — R0602 Shortness of breath: Secondary | ICD-10-CM | POA: Diagnosis not present

## 2024-02-01 ENCOUNTER — Other Ambulatory Visit: Payer: Self-pay | Admitting: Internal Medicine

## 2024-02-01 DIAGNOSIS — Z1231 Encounter for screening mammogram for malignant neoplasm of breast: Secondary | ICD-10-CM

## 2024-02-07 DIAGNOSIS — K219 Gastro-esophageal reflux disease without esophagitis: Secondary | ICD-10-CM | POA: Diagnosis not present

## 2024-02-07 DIAGNOSIS — J301 Allergic rhinitis due to pollen: Secondary | ICD-10-CM | POA: Diagnosis not present

## 2024-02-07 DIAGNOSIS — J4541 Moderate persistent asthma with (acute) exacerbation: Secondary | ICD-10-CM | POA: Diagnosis not present

## 2024-02-07 DIAGNOSIS — J8283 Eosinophilic asthma: Secondary | ICD-10-CM | POA: Diagnosis not present

## 2024-03-01 ENCOUNTER — Ambulatory Visit
Admission: RE | Admit: 2024-03-01 | Discharge: 2024-03-01 | Disposition: A | Discharge status: Home / Self Care | Source: Ambulatory Visit | Attending: Internal Medicine | Admitting: Internal Medicine

## 2024-03-01 DIAGNOSIS — Z1231 Encounter for screening mammogram for malignant neoplasm of breast: Secondary | ICD-10-CM | POA: Diagnosis not present

## 2024-04-05 DIAGNOSIS — E119 Type 2 diabetes mellitus without complications: Secondary | ICD-10-CM | POA: Diagnosis not present

## 2024-04-12 DIAGNOSIS — K219 Gastro-esophageal reflux disease without esophagitis: Secondary | ICD-10-CM | POA: Diagnosis not present

## 2024-04-12 DIAGNOSIS — Z87891 Personal history of nicotine dependence: Secondary | ICD-10-CM | POA: Diagnosis not present

## 2024-04-12 DIAGNOSIS — J4540 Moderate persistent asthma, uncomplicated: Secondary | ICD-10-CM | POA: Diagnosis not present

## 2024-04-12 DIAGNOSIS — Z1331 Encounter for screening for depression: Secondary | ICD-10-CM | POA: Diagnosis not present

## 2024-04-12 DIAGNOSIS — E7800 Pure hypercholesterolemia, unspecified: Secondary | ICD-10-CM | POA: Diagnosis not present

## 2024-04-12 DIAGNOSIS — I10 Essential (primary) hypertension: Secondary | ICD-10-CM | POA: Diagnosis not present

## 2024-04-12 DIAGNOSIS — Z0001 Encounter for general adult medical examination with abnormal findings: Secondary | ICD-10-CM | POA: Diagnosis not present

## 2024-04-12 DIAGNOSIS — E119 Type 2 diabetes mellitus without complications: Secondary | ICD-10-CM | POA: Diagnosis not present

## 2024-05-02 DIAGNOSIS — Z2821 Immunization not carried out because of patient refusal: Secondary | ICD-10-CM | POA: Diagnosis not present

## 2024-05-02 DIAGNOSIS — J8283 Eosinophilic asthma: Secondary | ICD-10-CM | POA: Diagnosis not present

## 2024-05-02 DIAGNOSIS — R0609 Other forms of dyspnea: Secondary | ICD-10-CM | POA: Diagnosis not present

## 2024-05-02 DIAGNOSIS — J301 Allergic rhinitis due to pollen: Secondary | ICD-10-CM | POA: Diagnosis not present

## 2024-05-31 DIAGNOSIS — J4540 Moderate persistent asthma, uncomplicated: Secondary | ICD-10-CM | POA: Diagnosis not present

## 2024-05-31 DIAGNOSIS — J8283 Eosinophilic asthma: Secondary | ICD-10-CM | POA: Diagnosis not present

## 2024-06-05 DIAGNOSIS — M542 Cervicalgia: Secondary | ICD-10-CM | POA: Diagnosis not present

## 2024-06-05 DIAGNOSIS — M25511 Pain in right shoulder: Secondary | ICD-10-CM | POA: Diagnosis not present

## 2024-07-13 DIAGNOSIS — M75101 Unspecified rotator cuff tear or rupture of right shoulder, not specified as traumatic: Secondary | ICD-10-CM | POA: Diagnosis not present

## 2024-07-13 DIAGNOSIS — M1712 Unilateral primary osteoarthritis, left knee: Secondary | ICD-10-CM | POA: Diagnosis not present

## 2024-08-03 ENCOUNTER — Emergency Department

## 2024-08-03 ENCOUNTER — Emergency Department
Admission: EM | Admit: 2024-08-03 | Discharge: 2024-08-03 | Disposition: A | Discharge status: Home / Self Care | Attending: Emergency Medicine | Admitting: Emergency Medicine

## 2024-08-03 ENCOUNTER — Other Ambulatory Visit: Payer: Self-pay

## 2024-08-03 ENCOUNTER — Encounter: Payer: Self-pay | Admitting: Emergency Medicine

## 2024-08-03 DIAGNOSIS — J101 Influenza due to other identified influenza virus with other respiratory manifestations: Secondary | ICD-10-CM | POA: Diagnosis not present

## 2024-08-03 DIAGNOSIS — R059 Cough, unspecified: Secondary | ICD-10-CM | POA: Diagnosis present

## 2024-08-03 LAB — RESP PANEL BY RT-PCR (RSV, FLU A&B, COVID)  RVPGX2
Influenza A by PCR: POSITIVE — AB
Influenza B by PCR: NEGATIVE
Resp Syncytial Virus by PCR: NEGATIVE
SARS Coronavirus 2 by RT PCR: NEGATIVE

## 2024-08-03 MED ORDER — OSELTAMIVIR PHOSPHATE 75 MG PO CAPS: 75.0000 mg | ORAL_CAPSULE | Freq: Two times a day (BID) | ORAL | 0 refills | Status: AC

## 2024-08-03 MED ORDER — PROMETHAZINE-DM 6.25-15 MG/5ML PO SYRP: 5.0000 mL | ORAL_SOLUTION | Freq: Four times a day (QID) | ORAL | 0 refills | Status: DC | PRN

## 2024-08-03 MED ORDER — PROMETHAZINE-DM 6.25-15 MG/5ML PO SYRP: 5.0000 mL | ORAL_SOLUTION | Freq: Four times a day (QID) | ORAL | 0 refills | Status: AC | PRN

## 2024-08-03 NOTE — ED Triage Notes (Signed)
 Pt reports cold symptoms starting Wednesday afternoon. Endorses congestion, cough, headache.

## 2024-08-03 NOTE — Discharge Instructions (Addendum)
 Follow-up with your primary care provider if any continued problems or concerns.  A prescription for cough medication and Tamiflu  was sent to the pharmacy.  Drink lots of fluids to stay hydrated and Tylenol  or ibuprofen  as needed for body aches, headache or fever.  The flu is very contagious.  Continue using your albuterol nebulizer machine at home.  Return to the emergency department if any severe worsening of your symptoms.

## 2024-08-03 NOTE — ED Provider Notes (Signed)
 "  [facility] Provider Note    Event Date/Time   First MD Initiated Contact with Patient 08/03/24 [phone]     (approximate)   History   Nasal Congestion and Cough   HPI  Kari Mcgee is a 78 y.o. female   presents to the ED with complaint of URI symptoms 3 days ago.  Patient also endorses a headache.  Patient has history of GERD and type 2 diabetes.      Physical Exam   Triage Vital Signs: ED Triage Vitals [08/03/24 0723]  Encounter Vitals Group     BP (!) 141/72     Girls Systolic BP Percentile      Girls Diastolic BP Percentile      Boys Systolic BP Percentile      Boys Diastolic BP Percentile      Pulse Rate 94     Resp 18     Temp 98.6 F (37 C)     Temp Source Oral     SpO2 99 %     Weight 138 lb 0.1 oz (62.6 kg)     Height 5' 2 (1.575 m)     Head Circumference      Peak Flow      Pain Score 8     Pain Loc      Pain Education      Exclude from Growth Chart     Most recent vital signs: Vitals:   08/03/24 0723  BP: (!) 141/72  Pulse: 94  Resp: 18  Temp: 98.6 F (37 C)  SpO2: 99%     General: Awake, no distress.  CV:  Good peripheral perfusion.  Heart regular rate and rhythm. Resp:  Normal effort.  Lungs are clear bilaterally. Abd:  No distention.  Other:     ED Results / Procedures / Treatments   Labs (all labs ordered are listed, but only abnormal results are displayed) Labs Reviewed  RESP PANEL BY RT-PCR (RSV, FLU A&B, COVID)  RVPGX2 - Abnormal; Notable for the following components:      Result Value   Influenza A by PCR POSITIVE (*)    All other components within normal limits     RADIOLOGY Chest x-ray images were reviewed and interpreted by myself independent of the radiologist and was negative for pneumonia.  Official radiology report shows no active cardiopulmonary findings.    PROCEDURES:  Critical Care performed:   Procedures   MEDICATIONS ORDERED IN ED: Medications - No data to  display   IMPRESSION / MDM / ASSESSMENT AND PLAN / ED COURSE  I reviewed the triage vital signs and the nursing notes.   Differential diagnosis includes, but is not limited to, viral URI, COVID, influenza, RSV, pneumonia considered but unlikely.  78 year old female presents to the ED with complaint of nasal congestion and cough.  Patient was made aware that she was positive for influenza A.  We discussed her chest x-ray.  She states that she has been taking Robitussin without any relief of her nasal congestion.  She also wants medicine specifically for influenza.  We discussed Tamiflu  which may or may not help.  She agrees that this is what she would like to take and a prescription for promethazine  dextromethorphan was sent to the pharmacy to help with her nasal congestion and cough as needed.  She is also encouraged to increase fluids and Tylenol  or ibuprofen  as needed for body aches, headache or fever.      Patient's  presentation is most consistent with acute complicated illness / injury requiring diagnostic workup.  FINAL CLINICAL IMPRESSION(S) / ED DIAGNOSES   Final diagnoses:  Influenza A     Rx / DC Orders   ED Discharge Orders          Ordered    oseltamivir  (TAMIFLU ) 75 MG capsule  2 times daily        08/03/24 0857    promethazine -dextromethorphan (PROMETHAZINE -DM) 6.25-15 MG/5ML syrup  4 times daily PRN,   Status:  Discontinued        08/03/24 0857    promethazine -dextromethorphan (PROMETHAZINE -DM) 6.25-15 MG/5ML syrup  4 times daily PRN        08/03/24 0858             Note:  This document was prepared using Dragon voice recognition software and may include unintentional dictation errors.   Saunders Shona CROME, PA-C 08/03/24 1358    Jossie Artist POUR, MD 08/05/24 [phone]  "

## 2024-08-03 NOTE — ED Notes (Signed)
 See triage note  Presents with cough and congestion  States sxs' started on Wednesday  Afebrile on arrival
